# Patient Record
Sex: Female | Born: 1960
Health system: Southern US, Community
[De-identification: ages and names within clinical notes are randomized; demographics above are authoritative.]

## PROBLEM LIST (undated history)

## (undated) DIAGNOSIS — Z789 Other specified health status: Secondary | ICD-10-CM

## (undated) DIAGNOSIS — Z8619 Personal history of other infectious and parasitic diseases: Secondary | ICD-10-CM

## (undated) DIAGNOSIS — Q030 Malformations of aqueduct of Sylvius: Secondary | ICD-10-CM

## (undated) HISTORY — DX: Other specified health status: Z78.9

## (undated) HISTORY — PX: OOPHORECTOMY: SHX86

## (undated) HISTORY — PX: MICRODISCECTOMY LUMBAR: SUR864

## (undated) HISTORY — DX: Malformations of aqueduct of Sylvius: Q03.0

## (undated) HISTORY — DX: Personal history of other infectious and parasitic diseases: Z86.19

## (undated) HISTORY — PX: OTHER SURGICAL HISTORY: SHX169

---

## 2000-11-23 ENCOUNTER — Encounter (INDEPENDENT_AMBULATORY_CARE_PROVIDER_SITE_OTHER): Payer: Self-pay | Admitting: Specialist

## 2000-11-23 ENCOUNTER — Other Ambulatory Visit: Admission: RE | Admit: 2000-11-23 | Discharge: 2000-11-23 | Payer: Self-pay | Admitting: Obstetrics and Gynecology

## 2001-12-07 ENCOUNTER — Encounter: Payer: Self-pay | Admitting: Family Medicine

## 2001-12-07 ENCOUNTER — Ambulatory Visit (HOSPITAL_COMMUNITY): Admission: RE | Admit: 2001-12-07 | Discharge: 2001-12-07 | Payer: Self-pay | Admitting: Family Medicine

## 2003-12-25 ENCOUNTER — Other Ambulatory Visit: Admission: RE | Admit: 2003-12-25 | Discharge: 2003-12-25 | Payer: Self-pay | Admitting: Obstetrics and Gynecology

## 2005-06-19 ENCOUNTER — Other Ambulatory Visit: Admission: RE | Admit: 2005-06-19 | Discharge: 2005-06-19 | Payer: Self-pay | Admitting: Obstetrics and Gynecology

## 2008-12-20 ENCOUNTER — Encounter: Payer: Self-pay | Admitting: Family Medicine

## 2009-11-21 ENCOUNTER — Ambulatory Visit: Payer: Self-pay | Admitting: Family Medicine

## 2009-11-21 DIAGNOSIS — G43909 Migraine, unspecified, not intractable, without status migrainosus: Secondary | ICD-10-CM | POA: Insufficient documentation

## 2009-11-21 DIAGNOSIS — Z862 Personal history of diseases of the blood and blood-forming organs and certain disorders involving the immune mechanism: Secondary | ICD-10-CM | POA: Insufficient documentation

## 2009-12-26 ENCOUNTER — Ambulatory Visit: Payer: Self-pay | Admitting: Family Medicine

## 2010-05-17 ENCOUNTER — Ambulatory Visit: Payer: Self-pay | Admitting: Family Medicine

## 2010-05-28 ENCOUNTER — Ambulatory Visit: Payer: Self-pay | Admitting: Family Medicine

## 2010-05-28 LAB — CONVERTED CEMR LAB
ALT: 16 units/L (ref 0–35)
AST: 18 units/L (ref 0–37)
Albumin: 4.5 g/dL (ref 3.5–5.2)
Alkaline Phosphatase: 49 units/L (ref 39–117)
BUN: 14 mg/dL (ref 6–23)
Basophils Absolute: 0 10*3/uL (ref 0.0–0.1)
Basophils Relative: 0.4 % (ref 0.0–3.0)
Bilirubin Urine: NEGATIVE
Bilirubin, Direct: 0.1 mg/dL (ref 0.0–0.3)
Blood in Urine, dipstick: NEGATIVE
CO2: 28 meq/L (ref 19–32)
Calcium: 9.2 mg/dL (ref 8.4–10.5)
Chloride: 104 meq/L (ref 96–112)
Cholesterol: 184 mg/dL (ref 0–200)
Creatinine, Ser: 0.8 mg/dL (ref 0.4–1.2)
Eosinophils Absolute: 0.1 10*3/uL (ref 0.0–0.7)
Eosinophils Relative: 1.2 % (ref 0.0–5.0)
GFR calc non Af Amer: 79.56 mL/min (ref >60)
Glucose, Bld: 103 mg/dL — ABNORMAL HIGH (ref 70–99)
Glucose, Urine, Semiquant: NEGATIVE
HCT: 41.8 % (ref 36.0–46.0)
HDL: 61.1 mg/dL (ref >39.00)
Hemoglobin: 14.4 g/dL (ref 12.0–15.0)
Ketones, urine, test strip: NEGATIVE
LDL Cholesterol: 112 mg/dL — ABNORMAL HIGH (ref 0–99)
Lymphocytes Relative: 26.1 % (ref 12.0–46.0)
Lymphs Abs: 1.4 10*3/uL (ref 0.7–4.0)
MCHC: 34.4 g/dL (ref 30.0–36.0)
MCV: 95.4 fL (ref 78.0–100.0)
Monocytes Absolute: 0.2 10*3/uL (ref 0.1–1.0)
Monocytes Relative: 4.5 % (ref 3.0–12.0)
Neutro Abs: 3.6 10*3/uL (ref 1.4–7.7)
Neutrophils Relative %: 67.8 % (ref 43.0–77.0)
Nitrite: NEGATIVE
Platelets: 151 10*3/uL (ref 150.0–400.0)
Potassium: 4.4 meq/L (ref 3.5–5.1)
Protein, U semiquant: NEGATIVE
RBC: 4.38 M/uL (ref 3.87–5.11)
RDW: 13.2 % (ref 11.5–14.6)
Sodium: 139 meq/L (ref 135–145)
Specific Gravity, Urine: 1.015
TSH: 0.64 microintl units/mL (ref 0.35–5.50)
Total Bilirubin: 0.6 mg/dL (ref 0.3–1.2)
Total CHOL/HDL Ratio: 3
Total Protein: 7.1 g/dL (ref 6.0–8.3)
Triglycerides: 55 mg/dL (ref 0.0–149.0)
Urobilinogen, UA: 0.2
VLDL: 11 mg/dL (ref 0.0–40.0)
WBC Urine, dipstick: NEGATIVE
WBC: 5.3 10*3/uL (ref 4.5–10.5)
pH: 7

## 2010-06-11 ENCOUNTER — Ambulatory Visit: Payer: Self-pay | Admitting: Family Medicine

## 2010-09-03 ENCOUNTER — Encounter: Payer: Self-pay | Admitting: Family Medicine

## 2010-10-01 NOTE — Assessment & Plan Note (Signed)
Summary: CPX/CJR   Vital Signs:  Patient profile:   50 year old female Menstrual status:  irregular LMP:     06/11/2010 Height:      65.75 inches Weight:      130 pounds Temp:     97.8 degrees F oral Pulse rate:   80 / minute Pulse rhythm:   regular Resp:     12 per minute BP sitting:   110 / 70  (left arm) Cuff size:   regular  Vitals Entered By: Sid Falcon LPN (June 11, 2010 10:30 AM)  History of Present Illness: Here for CPE.  Sees gyn.  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 No hx of screening colonoscopy (turns 49 this week). Refuses flu vaccine. Walks for exercise.    Clinical Review Panels:  Immunizations   Last Tetanus Booster:  Tdap (12/26/2009)  Lipid Management   Cholesterol:  184 (05/28/2010)   LDL (bad choesterol):  112 (05/28/2010)   HDL (good cholesterol):  61.10 (05/28/2010)  Diabetes Management   Creatinine:  0.8 (05/28/2010)  CBC   WBC:  5.3 (05/28/2010)   RBC:  4.38 (05/28/2010)   Hgb:  14.4 (05/28/2010)   Hct:  41.8 (05/28/2010)   Platelets:  151.0 (05/28/2010)   MCV  95.4 (05/28/2010)   MCHC  34.4 (05/28/2010)   RDW  13.2 (05/28/2010)   PMN:  67.8 (05/28/2010)   Lymphs:  26.1 (05/28/2010)   Monos:  4.5 (05/28/2010)   Eosinophils:  1.2 (05/28/2010)   Basophil:  0.4 (05/28/2010)  Complete Metabolic Panel   Glucose:  103 (05/28/2010)   Sodium:  139 (05/28/2010)   Potassium:  4.4 (05/28/2010)   Chloride:  104 (05/28/2010)   CO2:  28 (05/28/2010)   BUN:  14 (05/28/2010)   Creatinine:  0.8 (05/28/2010)   Albumin:  4.5 (05/28/2010)   Total Protein:  7.1 (05/28/2010)   Calcium:  9.2 (05/28/2010)   Total Bili:  0.6 (05/28/2010)   Alk Phos:  49 (05/28/2010)   SGPT (ALT):  16 (05/28/2010)   SGOT (AST):  18 (05/28/2010)   Allergies: No Known Drug Allergies  Past History:  Past Medical History: Last updated: 11/21/2009 Anemia Migraine headaches  Family History: Last updated: 06/11/2010 Both  grandfathers, stroke history Mother died PE complications.  Social History: Last updated: 11/21/2009 Occupation:  Veterinarian Married Alcohol use-no Past smoker 2 pregnancies 3 live births  Risk Factors: Smoking Status: quit (11/21/2009)  Past Surgical History: micro diskectomy 1987 L5 S1.  Oophorectomy 1990 C-Section-twins 1993 Dermoid tumor removed from ovary PMH-FH-SH reviewed for relevance  Family History: Both grandfathers, stroke history Mother died PE complications.  Review of Systems  The patient denies anorexia, fever, weight loss, weight gain, vision loss, decreased hearing, hoarseness, chest pain, syncope, dyspnea on exertion, peripheral edema, prolonged cough, headaches, hemoptysis, abdominal pain, melena, hematochezia, severe indigestion/heartburn, hematuria, incontinence, genital sores, muscle weakness, suspicious skin lesions, transient blindness, difficulty walking, depression, unusual weight change, abnormal bleeding, enlarged lymph nodes, and breast masses.    Physical Exam  General:  Well-developed,well-nourished,in no acute distress; alert,appropriate and cooperative throughout examination Head:  Normocephalic and atraumatic without obvious abnormalities. No apparent alopecia or balding. Eyes:  No corneal or conjunctival inflammation noted. EOMI. Perrla. Funduscopic exam benign, without hemorrhages, exudates or papilledema. Vision grossly normal. Ears:  External ear exam shows no significant lesions or deformities.  Otoscopic examination reveals clear canals, tympanic membranes are intact bilaterally without bulging, retraction, inflammation or discharge. Hearing is grossly normal bilaterally. Mouth:  Oral mucosa and oropharynx without lesions or exudates.  Teeth in good repair. Neck:  No deformities, masses, or tenderness noted. Breasts:  gyn Lungs:  Normal respiratory effort, chest expands symmetrically. Lungs are clear to auscultation, no crackles or  wheezes. Heart:  Normal rate and regular rhythm. S1 and S2 normal without gallop, murmur, click, rub or other extra sounds. Abdomen:  Bowel sounds positive,abdomen soft and non-tender without masses, organomegaly or hernias noted. Rectal:  gyn Genitalia:  gyn Msk:  No deformity or scoliosis noted of thoracic or lumbar spine.   Extremities:  No clubbing, cyanosis, edema, or deformity noted with normal full range of motion of all joints.   Neurologic:  alert & oriented X3 and cranial nerves II-XII intact.   Skin:  no rashes and no suspicious lesions.   Cervical Nodes:  No lymphadenopathy noted Psych:  Cognition and judgment appear intact. Alert and cooperative with normal attention span and concentration. No apparent delusions, illusions, hallucinations   Impression & Recommendations:  Problem # 1:  Preventive Health Care (ICD-V70.0) labs reviewed with pt.  Schedule colonoscopy.  Cont regular exercise.  Cont regular gyn f/u.  Other Orders: Gastroenterology Referral (GI)  Anticoagulation Management Assessment/Plan:

## 2010-10-01 NOTE — Assessment & Plan Note (Signed)
Summary: TETANUS SHOT/CJR  Nurse Visit   Allergies: No Known Drug Allergies  Immunizations Administered:  Tetanus Vaccine:    Vaccine Type: Tdap    Site: left deltoid    Mfr: GlaxoSmithKline    Dose: 0.5 ml    Route: IM    Given by: Lynann Beaver CMA    Exp. Date: 11/24/2011    Lot #: [id number]  Orders Added: 1)  Tdap => 14yrs IM [90715] 2)  Admin 1st Vaccine [90471]

## 2010-10-01 NOTE — Assessment & Plan Note (Signed)
Summary: EVAL OF WOUND ON R HAND (CAT BITE) // RS   Vital Signs:  Patient profile:   50 year old female Menstrual status:  irregular Temp:     98.2 degrees F oral BP sitting:   120 / 80  (left arm) Cuff size:   regular  Vitals Entered By: Sid Falcon LPN (May 17, 2010 1:53 PM)  History of Present Illness: Patient seen same day appointment right thumb swelling.  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 Started herself on doxycycline without much improvement. Past couple days increased redness and swelling and pain. No fever or chills. Tetanus is up-to-date.  Cat had been vaccinated for rabies. Patient has also had prior rabies vaccine.  Allergies: No Known Drug Allergies  Review of Systems  The patient denies fever.    Physical Exam  General:  Well-developed,well-nourished,in no acute distress; alert,appropriate and cooperative throughout examination Mouth:  no gingival abnormalities.   Extremities:  right thumb reveals swelling erythema and mild tenderness involving most of the thumb with a couple of puncture wounds between the MCP and interphalangeal joint. She has full range of motion thumb  No involement of hand.   Impression & Recommendations:  Problem # 1:  CAT BITE (ICD-E906.3) Assessment New patient has cellulitis changes right thumb. Switch to Augmentin 875 mg b.i.d. to cover for Pasturella multocida.  Complete Medication List: 1)  Amoxicillin-pot Clavulanate 875-125 Mg Tabs (Amoxicillin-pot clavulanate) .... One by mouth two times a day for 10 days  Patient Instructions: 1)  Use topical heat to thumb several times daily. 2)  Touch base by Monday if not improving. Prescriptions: AMOXICILLIN-POT CLAVULANATE 875-125 MG TABS (AMOXICILLIN-POT CLAVULANATE) one by mouth two times a day for 10 days  #20 x 0   Entered and Authorized by:   Evelena Peat MD   Signed by:   Evelena Peat MD on 05/17/2010   Method used:   Print then Give to Patient  RxID:   [id number]

## 2010-10-01 NOTE — Assessment & Plan Note (Signed)
Summary: NEW PT TO LBF/TO EST/PT COMPLAINING OF TINGLING IN RT LEG/BAC...   Vital Signs:  Patient profile:   50 year old female Menstrual status:  irregular LMP:     10/14/2009 Height:      65.50 inches Weight:      132 pounds BMI:     21.71 Temp:     97.8 degrees F oral Pulse rate:   72 / minute Pulse rhythm:   regular Resp:     12 per minute BP sitting:   100 / 70  (left arm) Cuff size:   regular  Vitals Entered By: Sid Falcon LPN (November 21, 2009 11:25 AM) CC: New to establish, c/o tingling right leg, back pain LMP (date): 10/14/2009     Menstrual Status irregular Enter LMP: 10/14/2009   History of Present Illness: New patient to establish care.  Patient has history of migraine headaches which are very infrequent. Remote history of microdiscectomy 1987 which she thinks was left-sided. Takes no regular medications. Remote history of anemia secondary to menometrorrhagia. No recent dizziness.  Family history and social history are reviewed. She is married has 3 children. No history of smoking. Occasional alcohol use. Works as a International aid/development worker.  Acute issue is two-day history of some tingling sensation right lower extremity. No specific injury though symptoms did start after doing some light running 2 days ago. No back pain. No weakness. No loss of bladder or bowel control. Some Aleve without relief. Denies any other focal neurologic symptoms such as blurred vision, ataxia, focal weakness, or headache.  Preventive Screening-Counseling & Management  Alcohol-Tobacco     Smoking Status: quit     Year Started: 1975     Year Quit: 1991  Allergies (verified): No Known Drug Allergies  Past History:  Family History: Last updated: 11/21/2009 Both grandfathers, stroke history  Social History: Last updated: 11/21/2009 Occupation:  International aid/development worker Married Alcohol use-no Past smoker 2 pregnancies 3 live births  Risk Factors: Smoking Status: quit (11/21/2009)  Past  Medical History: Anemia Migraine headaches  Past Surgical History: micro diskectomy 1987 Oophorectomy 1990 C-Section-twins 1993 Dermoid tumor removed from ovary  Family History: Both grandfathers, stroke history  Social History: Occupation:  International aid/development worker Married Alcohol use-no Past smoker 2 pregnancies 3 live births Occupation:  employed Smoking Status:  quit  Review of Systems  The patient denies anorexia, fever, weight loss, vision loss, decreased hearing, chest pain, syncope, dyspnea on exertion, peripheral edema, headaches, incontinence, and muscle weakness.    Physical Exam  General:  Well-developed,well-nourished,in no acute distress; alert,appropriate and cooperative throughout examination Eyes:  No corneal or conjunctival inflammation noted. EOMI. Perrla. Funduscopic exam benign, without hemorrhages, exudates or papilledema. Vision grossly normal. Neck:  No deformities, masses, or tenderness noted. Lungs:  Normal respiratory effort, chest expands symmetrically. Lungs are clear to auscultation, no crackles or wheezes. Heart:  Normal rate and regular rhythm. S1 and S2 normal without gallop, murmur, click, rub or other extra sounds. Msk:  normal ROM hip. Extremities:  No clubbing, cyanosis, edema, or deformity noted with normal full range of motion of all joints.   Neurologic:  alert & oriented X3, cranial nerves II-XII intact, strength normal in all extremities, gait normal, DTRs symmetrical and normal, and toes down bilaterally on Babinski.     Impression & Recommendations:  Problem # 1:  PARESTHESIA (ICD-782.0) Assessment New nonfocal neuro exam.  Observe for now since sxs only of 2 days duration.  Consider MRI brain if sxs persist or worsen.  Doubt lumbar  nerve root compression as pt has no pain and no back pain.  Problem # 2:  MIGRAINE HEADACHE (ICD-346.90) Assessment: Comment Only  Problem # 3:  ANEMIA, HX OF (ICD-V12.3) Assessment: Comment  Only  Preventive Care Screening  Last Tetanus Booster:    Date:  09/02/1999    Results:  Historical

## 2010-10-03 NOTE — Letter (Signed)
Summary: Referral - not able to see patient  [facility]  [address] [place], [place] [address]   Phone: [phone]  Fax: [phone]    September 03, 2010   Evelena Peat, MD  [address] [place], [place] [address]   Re:   Ann Henderson DOB:  May 24, 1961 MRN:   [id number]    Dear Dr. Caryl Never:  Thank you for your kind referral of the above patient.  We have attempted to schedule the recommended procedure Screening Colonscopy but have not been able to schedule because:  X   The patient was not available by phone and/or has not returned our calls.  ___ The patient declined to schedule the procedure at this time.  We appreciate the referral and hope that we will have the opportunity to treat this patient in the future.    Sincerely,    [facility] [phone]

## 2011-01-30 ENCOUNTER — Ambulatory Visit (INDEPENDENT_AMBULATORY_CARE_PROVIDER_SITE_OTHER): Payer: BC Managed Care – PPO | Admitting: Family Medicine

## 2011-01-30 ENCOUNTER — Encounter: Payer: Self-pay | Admitting: Family Medicine

## 2011-01-30 VITALS — BP 100/70 | Temp 98.0°F

## 2011-01-30 DIAGNOSIS — M79602 Pain in left arm: Secondary | ICD-10-CM

## 2011-01-30 DIAGNOSIS — M79609 Pain in unspecified limb: Secondary | ICD-10-CM

## 2011-01-30 NOTE — Progress Notes (Signed)
  Subjective:    Patient ID: Ann Henderson, female    DOB: [birth year], 50 y.o.   MRN: [id number]  HPI Patient seen with left arm pain. Started back in March. 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 Burning sensation left axillary region with radiation of pain toward elbow. No numbness or weakness. No neck pain. No muscle atrophy. Advil without much relief. Still lifting weights and exercising without much difficulty. Pain exacerbated by reaching overhead. No biceps pain. No shoulder pain   Review of Systems  Cardiovascular: Negative for chest pain.  Neurological: Negative for weakness and headaches.  Hematological: Negative for adenopathy. Does not bruise/bleed easily.       Objective:   Physical Exam  Constitutional: She appears well-developed and well-nourished. No distress.  Neck: Neck supple. No thyromegaly present.  Cardiovascular: Normal rate, regular rhythm and normal heart sounds.   No murmur heard. Pulmonary/Chest: Effort normal and breath sounds normal. No respiratory distress. She has no wheezes. She has no rales.  Musculoskeletal: She exhibits no edema.       Left shoulder full range of motion. No atrophy left upper extremity. Strength full throughout. No bicipital tenderness. No triceps tenderness. No deltoid tenderness. No axillary mass. Full range of motion left elbow. No axillary adenopathy  Lymphadenopathy:    She has no cervical adenopathy.  Neurological:       Deep tendon reflexes symmetric upper extremities. Normal sensory function. No weakness  Skin: No rash noted.          Assessment & Plan:  Left arm pain. Suspect brachial plexus injury. No neurologic signs of weakness. Reassurance and observation for now. Touch base in one month if not improving further

## 2011-05-07 ENCOUNTER — Emergency Department (HOSPITAL_COMMUNITY)
Admission: EM | Admit: 2011-05-07 | Discharge: 2011-05-07 | Disposition: A | Discharge status: Home / Self Care | Payer: BC Managed Care – PPO | Attending: Emergency Medicine | Admitting: Emergency Medicine

## 2011-05-07 ENCOUNTER — Emergency Department (HOSPITAL_COMMUNITY): Payer: BC Managed Care – PPO

## 2011-05-07 DIAGNOSIS — F29 Unspecified psychosis not due to a substance or known physiological condition: Secondary | ICD-10-CM | POA: Insufficient documentation

## 2011-05-07 DIAGNOSIS — M549 Dorsalgia, unspecified: Secondary | ICD-10-CM | POA: Insufficient documentation

## 2011-05-07 DIAGNOSIS — R51 Headache: Secondary | ICD-10-CM | POA: Insufficient documentation

## 2011-05-07 DIAGNOSIS — M542 Cervicalgia: Secondary | ICD-10-CM | POA: Insufficient documentation

## 2011-05-07 DIAGNOSIS — S060X0A Concussion without loss of consciousness, initial encounter: Secondary | ICD-10-CM | POA: Insufficient documentation

## 2011-05-07 LAB — SAMPLE TO BLOOD BANK

## 2011-12-18 ENCOUNTER — Encounter: Payer: Self-pay | Admitting: Family Medicine

## 2011-12-18 ENCOUNTER — Ambulatory Visit (INDEPENDENT_AMBULATORY_CARE_PROVIDER_SITE_OTHER): Payer: BC Managed Care – PPO | Admitting: Family Medicine

## 2011-12-18 VITALS — BP 90/70 | Temp 97.8°F | Wt 130.0 lb

## 2011-12-18 DIAGNOSIS — R202 Paresthesia of skin: Secondary | ICD-10-CM

## 2011-12-18 DIAGNOSIS — R209 Unspecified disturbances of skin sensation: Secondary | ICD-10-CM
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
## 2011-12-30 ENCOUNTER — Encounter: Payer: Self-pay | Admitting: Neurology

## 2011-12-30 ENCOUNTER — Ambulatory Visit (INDEPENDENT_AMBULATORY_CARE_PROVIDER_SITE_OTHER): Payer: BC Managed Care – PPO | Admitting: Neurology

## 2011-12-30 VITALS — BP 108/70 | HR 72 | Wt 130.0 lb

## 2011-12-30 DIAGNOSIS — R531 Weakness: Secondary | ICD-10-CM

## 2011-12-30 DIAGNOSIS — Z8669 Personal history of other diseases of the nervous system and sense organs: Secondary | ICD-10-CM

## 2011-12-30 DIAGNOSIS — M6281 Muscle weakness (generalized): Secondary | ICD-10-CM
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
## 2011-12-30 NOTE — Patient Instructions (Signed)
Your MRI is scheduled for Friday, May 3rd at 7:00pm.  Please arrive to [facility] MRI by 6:45pm.  [phone].

## 2012-01-02 ENCOUNTER — Ambulatory Visit (HOSPITAL_COMMUNITY)
Admission: RE | Admit: 2012-01-02 | Discharge: 2012-01-02 | Disposition: A | Discharge status: Home / Self Care | Payer: BC Managed Care – PPO | Source: Ambulatory Visit | Attending: Neurology | Admitting: Neurology

## 2012-01-02 DIAGNOSIS — R209 Unspecified disturbances of skin sensation: Secondary | ICD-10-CM | POA: Insufficient documentation

## 2012-01-02 DIAGNOSIS — R531 Weakness: Secondary | ICD-10-CM

## 2012-01-02 DIAGNOSIS — Z8669 Personal history of other diseases of the nervous system and sense organs: Secondary | ICD-10-CM

## 2012-01-02 DIAGNOSIS — G911 Obstructive hydrocephalus: Secondary | ICD-10-CM | POA: Insufficient documentation

## 2012-01-02 MED ORDER — GADOBENATE DIMEGLUMINE 529 MG/ML IV SOLN
12.0000 mL | Freq: Once | INTRAVENOUS | Status: AC | PRN
  Administered 2012-01-02: 12 mL via INTRAVENOUS

## 2012-01-02 MED ORDER — GADOBENATE DIMEGLUMINE 529 MG/ML IV SOLN: 15.0000 mL | Freq: Once | INTRAVENOUS | Status: AC | PRN

## 2012-01-05 ENCOUNTER — Other Ambulatory Visit: Payer: Self-pay

## 2012-01-05 ENCOUNTER — Telehealth: Payer: Self-pay

## 2012-01-05 DIAGNOSIS — G959 Disease of spinal cord, unspecified: Secondary | ICD-10-CM

## 2012-01-05 NOTE — Telephone Encounter (Signed)
Pt notified of C spine MRI 01/09/12 at 8:00am.  [facility] MRI.

## 2012-01-05 NOTE — Telephone Encounter (Signed)
Message copied by Lelon Huh on Mon Jan 05, 2012  4:36 PM ------      Message from: Milas Gain      Created: Mon Jan 05, 2012  2:25 PM       Talked to patient, no parenchymal lesions, just old hydrocephalus.            Tahani Potier - could you set up an MRI of her cervical spine with and without contrast ?myelopathy and call the patient.

## 2012-01-09 ENCOUNTER — Ambulatory Visit (HOSPITAL_COMMUNITY)
Admission: RE | Admit: 2012-01-09 | Discharge: 2012-01-09 | Disposition: A | Discharge status: Home / Self Care | Payer: BC Managed Care – PPO | Source: Ambulatory Visit | Attending: Neurology | Admitting: Neurology

## 2012-01-09 DIAGNOSIS — N882 Stricture and stenosis of cervix uteri: Secondary | ICD-10-CM | POA: Insufficient documentation

## 2012-01-09 DIAGNOSIS — R209 Unspecified disturbances of skin sensation: Secondary | ICD-10-CM | POA: Insufficient documentation

## 2012-01-09 DIAGNOSIS — G959 Disease of spinal cord, unspecified: Secondary | ICD-10-CM

## 2012-01-09 MED ORDER — GADOBENATE DIMEGLUMINE 529 MG/ML IV SOLN
15.0000 mL | Freq: Once | INTRAVENOUS | Status: AC | PRN
  Administered 2012-01-09: 11 mL via INTRAVENOUS

## 2012-01-13 ENCOUNTER — Telehealth: Payer: Self-pay | Admitting: Neurology

## 2012-01-13 NOTE — Telephone Encounter (Signed)
Pt would like results of MRI performed on Friday 01/09/2012.

## 2012-01-14 ENCOUNTER — Telehealth: Payer: Self-pay | Admitting: Neurology

## 2012-01-14 NOTE — Telephone Encounter (Signed)
Message copied by Benay Spice on Wed Jan 14, 2012  9:34 AM ------      Message from: Milas Gain      Created: Tue Jan 13, 2012  4:03 PM       Jan - Let her know some mild arthritis with left sided moderate foraminal stenosis at C3, but otherwise no canal stenosis.  All in all spine looks very good.  No spinal cord changes that would account for sensory changes.  No signs of MS.

## 2012-01-14 NOTE — Telephone Encounter (Signed)
I think we should just watch and wait for now.  If she has worsening symptoms she should give me a call.  Otherwise I will see her back in 3 months.  Depending on her exam and her symptoms at that point we will consider rescanning her.

## 2012-01-14 NOTE — Telephone Encounter (Signed)
Called and spoke with the patient. Information given as per Dr. Modesto Charon re: MRI results. The patient wants to know what to do next? She has a f/u in 3 months and wonders if this is just something she will have to learn to live with. She would like to know what Dr. Modesto Charon recommends. She is aware he is out of the office today and is ok to wait. **Dr. Modesto Charon, please advise next step issues. Thanks.

## 2012-01-15 NOTE — Telephone Encounter (Signed)
Called and spoke with the patient. Information shared with the patient as per Dr. Modesto Charon below. No additional concerns voiced at this time.

## 2012-03-02 DIAGNOSIS — G919 Hydrocephalus, unspecified: Secondary | ICD-10-CM | POA: Insufficient documentation

## 2012-03-22 ENCOUNTER — Ambulatory Visit: Payer: BC Managed Care – PPO | Admitting: Family Medicine

## 2012-03-23 ENCOUNTER — Encounter: Payer: Self-pay | Admitting: Family Medicine

## 2012-03-23 NOTE — Progress Notes (Signed)
This encounter was created in error - please disregard.

## 2012-03-24 ENCOUNTER — Telehealth: Payer: Self-pay | Admitting: *Deleted

## 2012-03-24 MED ORDER — DOXYCYCLINE HYCLATE 100 MG PO TABS: 100.0000 mg | ORAL_TABLET | Freq: Two times a day (BID) | ORAL | Status: AC

## 2012-03-24 NOTE — Telephone Encounter (Signed)
I informed pt of Rx electronically, she was at [facility] waiting, so she asked call it in also as she is waiting, so I did call it in.

## 2012-03-24 NOTE — Telephone Encounter (Signed)
Patient recently started herself on doxycycline. She had some vague neurologic symptoms with neurologic workup per neurologist including MR angiogram and MRI the brain unrevealing of clear etiology. She was concerned about possibility of Lyme disease and feels that her symptoms have improved since starting herself on doxycycline. She is basically calling requesting extension of doxycycline. 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 She has not had any clear rash suggestive of erythema chronica migrans. She states was diagnosed with Lyme disease several years ago. I spoke with her by phone. Even though her symptoms are very equivocal and not clear this is related to Lyme disease in any way we agreed to full three-week course of doxycycline 100 mg twice a day.  If she is interested in serologic testing would recommend office followup 2 discussed pros and cons and various tests available

## 2012-03-30 ENCOUNTER — Ambulatory Visit: Payer: BC Managed Care – PPO | Admitting: Neurology

## 2013-05-26 ENCOUNTER — Other Ambulatory Visit: Payer: Self-pay | Admitting: Obstetrics and Gynecology

## 2013-05-26 DIAGNOSIS — Z1231 Encounter for screening mammogram for malignant neoplasm of breast: Secondary | ICD-10-CM

## 2013-06-14 ENCOUNTER — Ambulatory Visit
Admission: RE | Admit: 2013-06-14 | Discharge: 2013-06-14 | Disposition: A | Discharge status: Home / Self Care | Payer: BC Managed Care – PPO | Source: Ambulatory Visit | Attending: Obstetrics and Gynecology | Admitting: Obstetrics and Gynecology

## 2013-06-14 DIAGNOSIS — Z1231 Encounter for screening mammogram for malignant neoplasm of breast: Secondary | ICD-10-CM

## 2014-06-02 ENCOUNTER — Ambulatory Visit (INDEPENDENT_AMBULATORY_CARE_PROVIDER_SITE_OTHER): Payer: BC Managed Care – PPO | Admitting: Family Medicine

## 2014-06-02 ENCOUNTER — Encounter: Payer: Self-pay | Admitting: Family Medicine

## 2014-06-02 VITALS — BP 120/80 | HR 68 | Temp 97.6°F | Wt 130.0 lb

## 2014-06-02 DIAGNOSIS — H8110 Benign paroxysmal vertigo, unspecified ear: Secondary | ICD-10-CM

## 2014-06-02 NOTE — Progress Notes (Signed)
Pre visit review using our clinic review tool, if applicable. No additional management support is needed unless otherwise documented below in the visit note. 

## 2014-06-02 NOTE — Patient Instructions (Signed)
Benign Positional Vertigo Vertigo means you feel like you or your surroundings are moving when they are not. Benign positional vertigo is the most common form of vertigo. Benign means that the cause of your condition is not serious. Benign positional vertigo is more common in older adults. CAUSES  Benign positional vertigo is the result of an upset in the labyrinth system. This is an area in the middle ear that helps control your balance. This may be caused by a viral infection, head injury, or repetitive motion. However, often no specific cause is found. SYMPTOMS  Symptoms of benign positional vertigo occur when you move your head or eyes in different directions. Some of the symptoms may include:  Loss of balance and falls.  Vomiting.  Blurred vision.  Dizziness.  Nausea.  Involuntary eye movements (nystagmus). DIAGNOSIS  Benign positional vertigo is usually diagnosed by physical exam. If the specific cause of your benign positional vertigo is unknown, your caregiver may perform imaging tests, such as magnetic resonance imaging (MRI) or computed tomography (CT). TREATMENT  Your caregiver may recommend movements or procedures to correct the benign positional vertigo. Medicines such as meclizine, benzodiazepines, and medicines for nausea may be used to treat your symptoms. In rare cases, if your symptoms are caused by certain conditions that affect the inner ear, you may need surgery. HOME CARE INSTRUCTIONS   Follow your caregiver's instructions.  Move slowly. Do not make sudden body or head movements.  Avoid driving.  Avoid operating heavy machinery.  Avoid performing any tasks that would be dangerous to you or others during a vertigo episode.  Drink enough fluids to keep your urine clear or pale yellow. SEEK IMMEDIATE MEDICAL CARE IF:   You develop problems with walking, weakness, numbness, or using your arms, hands, or legs.  You have difficulty speaking.  You develop  severe headaches.  Your nausea or vomiting continues or gets worse.  You develop visual changes.  Your family or friends notice any behavioral changes.  Your condition gets worse.  You have a fever.  You develop a stiff neck or sensitivity to light. MAKE SURE YOU:   Understand these instructions.  Will watch your condition.  Will get help right away if you are not doing well or get worse. Document Released: 05/26/2006 Document Revised: 11/10/2011 Document Reviewed: 05/08/2011 ExitCare Patient Information 2015 [employer]. This information is not intended to replace advice given to you by your health care provider. Make sure you discuss any questions you have with your health care provider.    

## 2014-06-02 NOTE — Progress Notes (Signed)
   Subjective:    Patient ID: Ann Henderson, female    DOB: 01/15/1961, 53 y.o.   MRN: [id number]  Dizziness Pertinent negatives include no chest pain, chills, fever or weakness.   Acute visit for vertigo. Onset 2 days ago. First noted when turning over in bed. Symptoms improved today. Mild nausea but no vomiting. No hearing changes. No focal weakness. No ataxia. No visual changes. Symptoms are stable at this time. She has had some recent nasal congestion but no fever or chills. Vertigo symptoms also improved yesterday after getting up and moving around.  No past medical history on file. Past Surgical History  Procedure Laterality Date  . Microdiscectomy lumbar    . Dermoid tumor    . Oophorectomy    . Cesarean section      reports that she quit smoking about 24 years ago. She has never used smokeless tobacco. She reports that she drinks alcohol. Her drug history is not on file. family history is not on file. No Known Allergies    Review of Systems  Constitutional: Negative for fever, chills, appetite change and unexpected weight change.  Respiratory: Negative for shortness of breath.   Cardiovascular: Negative for chest pain.  Neurological: Positive for dizziness. Negative for weakness.       Objective:   Physical Exam  Constitutional: She is oriented to person, place, and time. She appears well-developed and well-nourished. No distress.  HENT:  Right Ear: External ear normal.  Left Ear: External ear normal.  Neck: Neck supple. No thyromegaly present.  Cardiovascular: Normal rate and regular rhythm.   Pulmonary/Chest: Effort normal and breath sounds normal. No respiratory distress. She has no wheezes. She has no rales.  Lymphadenopathy:    She has no cervical adenopathy.  Neurological: She is alert and oriented to person, place, and time. No cranial nerve deficit.  She has some horizontal nystagmus with rapid direction to the left. No vertical nystagmus            Assessment & Plan:  Vertigo. Suspect benign peripheral positional vertigo. Reassurance. To try over-the-counter decongestant.  Reviewed signs and symptoms of more worrisome vertigo.  Vestibular rehab if symptoms persist.

## 2015-01-05 ENCOUNTER — Telehealth: Payer: Self-pay | Admitting: Family Medicine

## 2015-01-05 NOTE — Telephone Encounter (Signed)
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 Pt started herself on doxycycline. Pt does not need rx call in to pharm just the name of abx. Pt does not want to be seen.

## 2015-01-05 NOTE — Telephone Encounter (Signed)
Per Dr. Leonard SchwartzB start on Augmentin 875 BID for 10 days. Pt is informed. Pt is able to get medication

## 2015-01-19 ENCOUNTER — Telehealth: Payer: Self-pay | Admitting: Family Medicine

## 2015-01-19 NOTE — Telephone Encounter (Signed)
PLEASE NOTE: All timestamps contained within this report are represented as [place] Standard Time. CONFIDENTIALTY NOTICE: This fax transmission is intended only for the addressee. It contains information that is legally privileged, confidential or otherwise protected from use or disclosure. If you are not the intended recipient, you are strictly prohibited from reviewing, disclosing, copying using or disseminating any of this information or taking any action in reliance on or regarding this information. If you have received this fax in error, please notify us immediately by telephone so that we can arrange for its return to us. Phone: [phone], Toll-Free: [phone], Fax: [phone] Page: 1 of 1 Call Id: [id number] [facility] - Client TELEPHONE ADVICE RECORD [facility] Patient Name: Ann Henderson DOB: 05-04-1961 Initial Comment Caller states she has poison ivy and not getting any better. Nurse Assessment Nurse: Elijah Birkaldwell, RN, Lynda Date/Time (Eastern Time): 01/19/2015 1:36:57 PM Confirm and document reason for call. If symptomatic, describe symptoms. ---Caller states she has poison ivy and not getting any better. Used the rest room in wooded area, has a rash after using a leaf to wipe, but rash has spread in that area & rectal region. She is a International aid/development workerveterinarian. Started herself on steroids - 30 mg twice each day for the last couple days. Not sure if it is poison ivy or yeast infection. Had been on antibiotics before this started after a cat bite. Has tried a topical cream & yeast rx. The symptoms have been over a week now. Has the patient traveled out of the country within the last 30 days? ---Not Applicable Does the patient require triage? ---Yes Related visit to physician within the last 2 weeks? ---No Does the PT have any chronic conditions? (i.e. diabetes, asthma, etc.) ---No Did the patient indicate they were pregnant?  ---No Guidelines Guideline Title Affirmed Question Affirmed Notes Poison Ivy - Oak - [place] [1] Face, eyes, lips or genitals are involved AND [2] more than a small rash Final Disposition User See Physician within 24 Hours Columbine Valleyaldwell, RN, [employer]da Comments Caller states she is unsure whether or not to wait this out on the steroids, or whether to be seen. She is fairly sure she just grabbed a leaf from a tree that was not poison ivy. Suggested a few more ideas of care - Aveeno oatmeal bath, or soaking in saline bath, using 1% Hydrocortisone cream. She does not want an appt. now, may go to a walk-in clinic if needed.

## 2015-01-19 NOTE — Telephone Encounter (Signed)
Noted  

## 2015-01-22 NOTE — Telephone Encounter (Signed)
Let her know we are happy to see if rash not getting better.  If she feels more comfortable seeing a female provider, maybe we could set up with Dr Selena BattenKim or Fabian SharpPanosh.

## 2015-01-22 NOTE — Telephone Encounter (Signed)
Left message for patient to return call.

## 2015-01-24 NOTE — Telephone Encounter (Signed)
Left message for patient to return call.

## 2015-10-11 ENCOUNTER — Other Ambulatory Visit: Payer: Self-pay | Admitting: Obstetrics and Gynecology

## 2015-10-11 DIAGNOSIS — R928 Other abnormal and inconclusive findings on diagnostic imaging of breast: Secondary | ICD-10-CM

## 2015-10-16 ENCOUNTER — Ambulatory Visit
Admission: RE | Admit: 2015-10-16 | Discharge: 2015-10-16 | Disposition: A | Discharge status: Home / Self Care | Payer: BLUE CROSS/BLUE SHIELD | Source: Ambulatory Visit | Attending: Obstetrics and Gynecology | Admitting: Obstetrics and Gynecology

## 2015-10-16 DIAGNOSIS — R928 Other abnormal and inconclusive findings on diagnostic imaging of breast: Secondary | ICD-10-CM

## 2016-11-19 DIAGNOSIS — H524 Presbyopia: Secondary | ICD-10-CM | POA: Diagnosis not present

## 2016-11-19 DIAGNOSIS — H5203 Hypermetropia, bilateral: Secondary | ICD-10-CM | POA: Diagnosis not present

## 2016-11-19 DIAGNOSIS — H04123 Dry eye syndrome of bilateral lacrimal glands: Secondary | ICD-10-CM | POA: Diagnosis not present

## 2016-11-25 ENCOUNTER — Other Ambulatory Visit: Payer: Self-pay | Admitting: Obstetrics and Gynecology

## 2016-11-25 DIAGNOSIS — Z6821 Body mass index (BMI) 21.0-21.9, adult: Secondary | ICD-10-CM | POA: Diagnosis not present

## 2016-11-25 DIAGNOSIS — E2839 Other primary ovarian failure: Secondary | ICD-10-CM

## 2016-11-25 DIAGNOSIS — Z1231 Encounter for screening mammogram for malignant neoplasm of breast: Secondary | ICD-10-CM | POA: Diagnosis not present

## 2016-11-25 DIAGNOSIS — Z0000 Encounter for general adult medical examination without abnormal findings: Secondary | ICD-10-CM | POA: Diagnosis not present

## 2016-11-25 DIAGNOSIS — Z01419 Encounter for gynecological examination (general) (routine) without abnormal findings: Secondary | ICD-10-CM | POA: Diagnosis not present

## 2016-11-25 DIAGNOSIS — Z1389 Encounter for screening for other disorder: Secondary | ICD-10-CM | POA: Diagnosis not present

## 2016-12-16 ENCOUNTER — Ambulatory Visit
Admission: RE | Admit: 2016-12-16 | Discharge: 2016-12-16 | Disposition: A | Discharge status: Home / Self Care | Payer: BLUE CROSS/BLUE SHIELD | Source: Ambulatory Visit | Attending: Obstetrics and Gynecology | Admitting: Obstetrics and Gynecology

## 2016-12-16 DIAGNOSIS — Z780 Asymptomatic menopausal state: Secondary | ICD-10-CM | POA: Diagnosis not present

## 2016-12-16 DIAGNOSIS — M85852 Other specified disorders of bone density and structure, left thigh: Secondary | ICD-10-CM | POA: Diagnosis not present

## 2016-12-16 DIAGNOSIS — E2839 Other primary ovarian failure: Secondary | ICD-10-CM

## 2016-12-16 DIAGNOSIS — Z6821 Body mass index (BMI) 21.0-21.9, adult: Secondary | ICD-10-CM

## 2017-11-20 ENCOUNTER — Telehealth: Payer: Self-pay | Admitting: *Deleted

## 2017-11-20 NOTE — Telephone Encounter (Signed)
Dr. Caryl NeverBurchette, okay to transfer?

## 2017-11-20 NOTE — Telephone Encounter (Signed)
Copied from CRM [id number]. Topic: General - Other >> Nov 20, 2017 11:23 AM Arlyss Gandyichardson, Taren N, NT wrote: Reason for CRM: Pt would like to transfer care from Dr. Caryl NeverBurchette to Dr. Mardelle MatteAndy at [place] due to location.

## 2017-11-20 NOTE — Telephone Encounter (Signed)
Dr. Mardelle MatteAndy, okay to transfer?

## 2017-11-20 NOTE — Telephone Encounter (Signed)
Yes, please schedule. Thanks.

## 2017-11-20 NOTE — Telephone Encounter (Signed)
yes

## 2017-11-23 NOTE — Telephone Encounter (Signed)
LM for pt to return call for scheduling. °

## 2017-12-04 ENCOUNTER — Other Ambulatory Visit: Payer: Self-pay

## 2017-12-04 ENCOUNTER — Encounter: Payer: Self-pay | Admitting: Family Medicine

## 2017-12-04 ENCOUNTER — Ambulatory Visit: Payer: BLUE CROSS/BLUE SHIELD | Admitting: Family Medicine

## 2017-12-04 ENCOUNTER — Ambulatory Visit (INDEPENDENT_AMBULATORY_CARE_PROVIDER_SITE_OTHER): Payer: BLUE CROSS/BLUE SHIELD

## 2017-12-04 VITALS — BP 104/60 | HR 59 | Temp 97.6°F | Ht 65.75 in | Wt 128.8 lb

## 2017-12-04 DIAGNOSIS — Z0000 Encounter for general adult medical examination without abnormal findings: Secondary | ICD-10-CM

## 2017-12-04 DIAGNOSIS — R0781 Pleurodynia: Secondary | ICD-10-CM

## 2017-12-04 DIAGNOSIS — Q030 Malformations of aqueduct of Sylvius: Secondary | ICD-10-CM | POA: Diagnosis not present

## 2017-12-04 DIAGNOSIS — Z0001 Encounter for general adult medical examination with abnormal findings: Secondary | ICD-10-CM

## 2017-12-04 DIAGNOSIS — R0789 Other chest pain: Secondary | ICD-10-CM | POA: Diagnosis not present

## 2017-12-04 DIAGNOSIS — K429 Umbilical hernia without obstruction or gangrene: Secondary | ICD-10-CM | POA: Insufficient documentation

## 2017-12-04 DIAGNOSIS — J302 Other seasonal allergic rhinitis: Secondary | ICD-10-CM | POA: Insufficient documentation

## 2017-12-04 DIAGNOSIS — Z8619 Personal history of other infectious and parasitic diseases: Secondary | ICD-10-CM

## 2017-12-04 DIAGNOSIS — K635 Polyp of colon: Secondary | ICD-10-CM | POA: Diagnosis not present

## 2017-12-04 DIAGNOSIS — Z789 Other specified health status: Secondary | ICD-10-CM | POA: Insufficient documentation

## 2017-12-04 DIAGNOSIS — M47812 Spondylosis without myelopathy or radiculopathy, cervical region: Secondary | ICD-10-CM | POA: Insufficient documentation

## 2017-12-04 HISTORY — DX: Other specified health status: Z78.9

## 2017-12-04 HISTORY — DX: Personal history of other infectious and parasitic diseases: Z86.19

## 2017-12-04 HISTORY — DX: Malformations of aqueduct of Sylvius: Q03.0

## 2017-12-04 LAB — CBC WITH DIFFERENTIAL/PLATELET
BASOS PCT: 0.5 % (ref 0.0–3.0)
Basophils Absolute: 0 10*3/uL (ref 0.0–0.1)
EOS PCT: 1.1 % (ref 0.0–5.0)
Eosinophils Absolute: 0 10*3/uL (ref 0.0–0.7)
HEMATOCRIT: 41.7 % (ref 36.0–46.0)
HEMOGLOBIN: 14.4 g/dL (ref 12.0–15.0)
LYMPHS PCT: 37.7 % (ref 12.0–46.0)
Lymphs Abs: 1.7 10*3/uL (ref 0.7–4.0)
MCHC: 34.5 g/dL (ref 30.0–36.0)
MCV: 93.8 fl (ref 78.0–100.0)
Monocytes Absolute: 0.2 10*3/uL (ref 0.1–1.0)
Monocytes Relative: 4.9 % (ref 3.0–12.0)
Neutro Abs: 2.5 10*3/uL (ref 1.4–7.7)
Neutrophils Relative %: 55.8 % (ref 43.0–77.0)
Platelets: 158 10*3/uL (ref 150.0–400.0)
RBC: 4.45 Mil/uL (ref 3.87–5.11)
RDW: 12.9 % (ref 11.5–15.5)
WBC: 4.5 10*3/uL (ref 4.0–10.5)

## 2017-12-04 LAB — LIPID PANEL
CHOLESTEROL: 212 mg/dL — AB (ref 0–200)
HDL: 69.8 mg/dL (ref >39.00)
LDL CALC: 130 mg/dL — AB (ref 0–99)
NonHDL: 142.45
TRIGLYCERIDES: 63 mg/dL (ref 0.0–149.0)
Total CHOL/HDL Ratio: 3
VLDL: 12.6 mg/dL (ref 0.0–40.0)

## 2017-12-04 LAB — COMPREHENSIVE METABOLIC PANEL
ALBUMIN: 4.7 g/dL (ref 3.5–5.2)
ALT: 20 U/L (ref 0–35)
AST: 22 U/L (ref 0–37)
Alkaline Phosphatase: 67 U/L (ref 39–117)
BUN: 13 mg/dL (ref 6–23)
CALCIUM: 9.5 mg/dL (ref 8.4–10.5)
CO2: 29 mEq/L (ref 19–32)
CREATININE: 0.74 mg/dL (ref 0.40–1.20)
Chloride: 103 mEq/L (ref 96–112)
GFR: 85.83 mL/min (ref >60.00)
Glucose, Bld: 92 mg/dL (ref 70–99)
POTASSIUM: 4.2 meq/L (ref 3.5–5.1)
Sodium: 140 mEq/L (ref 135–145)
Total Bilirubin: 0.6 mg/dL (ref 0.2–1.2)
Total Protein: 7 g/dL (ref 6.0–8.3)

## 2017-12-04 LAB — TSH: TSH: 0.74 u[IU]/mL (ref 0.35–4.50)

## 2017-12-04 NOTE — Patient Instructions (Signed)
Please return in 12 months for your annual complete physical; please come fasting. OR sooner if your symptoms persist.  I will release your lab results to you on your MyChart account with further instructions. Please reply with any questions.   Please go to our [facility] [place] office to get your xrays done. You can walk in M-F between 8am and 5pm. Tell them you are there for xrays ordered by me. They will send me the results, then I will let you know the results with instructions.   Address: [address], [place], [place] [phone]  (office [address] at [address] at [address]; from here, turn left onto [address] to [address], turn right and go for a mile or so, office will be on left across form [address] )  It was a pleasure meeting you today! Thank you for choosing us to meet your healthcare needs! I truly look forward to working with you. If you have any questions or concerns, please send me a message via Mychart or call the office at [phone].

## 2017-12-04 NOTE — Progress Notes (Signed)
Subjective  CC:  Chief Complaint  Patient presents with  . Establish Care    Transfer from Dr. Caryl Never   . Cough    Patient states that she has been battling sickness for a few weeks, chest tightening    HPI: Ann Henderson is a 57 y.o. female who presents to [facility] today to establish care with me as a new patient.   She has the following concerns or needs:  Overall healthy female who has had most recent care from GYN; Female wellness exam was last year with nl pap. History of possible lymes disease, mild hydrocephalus evaluated by neuro and NS now asymtpomatic, and arthritis. 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 She eats vegetarian diet and runs regularly.   C/o 2 weeks of not feeling well. Started with mild ST at work and fatigue. No fever, body aches or GI sxs. Then over last week, c/o chest tightness while running and 48 hours of PND and cough with minimal sneezing. No sinus pain but has had congestion. No joint pain. No rash. No abdominal pain. No BMs and no urinary sxs. Does report pleuritic cp with deep breathing intermittently. She admits she's a little apathetic but no h/o depression. Fatigue - has gone to bed early several days recently. Fh with PE in mother and nephew: both family members died from large PE. Pt reports negative factor V leiden eval in past. She has no h/o clots. Denies calf pain or LE swelling. Doesn't feel sob. No palpitations   HM: would like cpe with lab work.   We updated and reviewed the patient's past history in detail and it is documented below.  Patient Active Problem List   Diagnosis Date Noted  . Hyperplastic colon polyp 12/04/2017  . Hydrocephalus with sparing of fourth ventricle due to aqueductal stenosis (HCC) 12/04/2017  . DJD (degenerative joint disease) of cervical spine 12/04/2017  . Seasonal allergic rhinitis 12/04/2017  . Vegetarian diet and gluten free 12/04/2017   . Umbilical hernia 12/04/2017  . H/o Lyme disease 12/04/2017   Health Maintenance  Topic Date Due  . Hepatitis C Screening  [birth year]  . HIV Screening  06/16/1975  . INFLUENZA VACCINE  04/01/2018  . DEXA SCAN  12/17/2018  . MAMMOGRAM  12/31/2018  . TETANUS/TDAP  12/27/2019  . PAP SMEAR  12/31/2019  . COLONOSCOPY  07/14/2023   Immunization History  Administered Date(s) Administered  . Td 09/02/1999, 12/26/2009   No outpatient medications have been marked as taking for the 12/04/17 encounter (Office Visit) with Willow Ora, MD.    Allergies: Patient has No Known Allergies. Past Medical History Patient  has a past medical history of H/o Lyme disease (12/04/2017), Hydrocephalus with sparing of fourth ventricle due to aqueductal stenosis (HCC) (12/04/2017), and Vegetarian diet and gluten free (12/04/2017). Past Surgical History Patient  has a past surgical history that includes Microdiscectomy lumbar; dermoid tumor; Oophorectomy; and Cesarean section. Family History: Patient family history includes Early death in her mother; Healthy in her daughter, daughter, father, and son; Heart disease in her maternal grandfather, paternal grandfather, and paternal grandmother; Lung cancer in her mother; Mental illness in her sister; Pulmonary embolism in her mother. Social History:  Patient  reports that she quit smoking about 27 years ago. She has never used smokeless tobacco. She reports that she drinks alcohol. She reports that she does not use drugs.  Review of Systems: Constitutional: negative for fever or malaise  Ophthalmic: negative for photophobia, double vision or loss of vision Cardiovascular: negative for chest pain, dyspnea on exertion, or new LE swelling Respiratory: negative for SOB or persistent cough Gastrointestinal: negative for abdominal pain, change in bowel habits or melena Genitourinary: negative for dysuria or gross hematuria Musculoskeletal: negative for new gait  disturbance or muscular weakness Integumentary: negative for new or persistent rashes Neurological: negative for TIA or stroke symptoms Psychiatric: negative for SI or delusions Allergic/Immunologic: negative for hives  Patient Care Team    Relationship Specialty Notifications Start End  Willow Ora, MD PCP - General Family Medicine  12/04/17   Huel Cote, MD Consulting Physician Obstetrics and Gynecology  12/04/17   Vida Rigger, MD Consulting Physician Gastroenterology  12/04/17     Objective  Vitals: BP 104/60   Pulse (!) 59   Temp 97.6 F (36.4 C)   Ht 5' 5.75" (1.67 m)   Wt 128 lb 12.8 oz (58.4 kg)   SpO2 97%   BMI 20.95 kg/m  General:  Well developed, well nourished, no acute distress , thin Psych:  Alert and oriented,normal mood and affect HEENT:  Normocephalic, atraumatic, non-icteric sclera, PERRL, nasal mucosa is inflamed with clear rhinorrhea, oropharynx is without mass or exudate, supple neck without adenopathy, mass or thyromegaly Cardiovascular:  RRR without gallop, rub or murmur, nondisplaced PMI Respiratory:  Good breath sounds bilaterally, CTAB with normal respiratory effort Gastrointestinal: normal bowel sounds, soft, non-tender, no noted masses. No HSM MSK: no deformities, contusions. Joints are without erythema or swelling Skin:  Warm, no rashes or suspicious lesions noted Neurologic:    Mental status is normal. Normal gait  Assessment  1. Annual physical exam   2. Hyperplastic colonic polyp, unspecified part of colon   3. Hydrocephalus with sparing of fourth ventricle due to aqueductal stenosis (HCC)   4. Pleuritic chest pain      Plan  Female Wellness Visit:  Age appropriate Health Maintenance and Prevention measures were discussed with patient. Included topics are cancer screening recommendations, ways to keep healthy (see AVS) including dietary and exercise recommendations, regular eye and dental care, use of seat belts, and avoidance of moderate  alcohol use and tobacco use.   BMI: discussed patient's BMI and encouraged positive lifestyle modifications to help get to or maintain a target BMI.  HM needs and immunizations were addressed and ordered. See below for orders. See HM and immunization section for updates.  Routine labs and screening tests ordered including cmp, cbc and lipids where appropriate.  Discussed recommendations regarding Vit D and calcium supplementation (see AVS)  Pleuritic cp and cough: most c/w viral etiology or allergies vs other: check labs and cxr. Given FH of clots, would consider thromboembolism. Consider spiral Ct if worsens. To ER for worsening sxs or SOB> f/u here in 1-2 weeks if sxs persist. Start oral antihistamine and decongestant. Rest and hydrate over weekend.   Follow up:  12 months for cpe; sooner if sx persist  Commons side effects, risks, benefits, and alternatives for medications and treatment plan prescribed today were discussed, and the patient expressed understanding of the given instructions. Patient is instructed to call or message via MyChart if he/she has any questions or concerns regarding our treatment plan. No barriers to understanding were identified. We discussed Red Flag symptoms and signs in detail. Patient expressed understanding regarding what to do in case of urgent or emergency type symptoms.   Medication list was reconciled, printed and provided to the patient in AVS. Patient instructions  and summary information was reviewed with the patient as documented in the AVS. This note was prepared with assistance of Dragon voice recognition software. Occasional wrong-word or sound-a-like substitutions may have occurred due to the inherent limitations of voice recognition software  Orders Placed This Encounter  Procedures  . DG Chest 2 View  . CBC with Differential/Platelet  . Comprehensive metabolic panel  . Lipid panel  . HIV antibody  . Hepatitis C antibody  . TSH   No orders of  the defined types were placed in this encounter.

## 2017-12-06 LAB — HEPATITIS C ANTIBODY
HEP C AB: NONREACTIVE
SIGNAL TO CUT-OFF: 0.01 (ref <1.00)

## 2017-12-06 LAB — HIV ANTIBODY (ROUTINE TESTING W REFLEX): HIV 1&2 Ab, 4th Generation: NONREACTIVE

## 2018-01-05 DIAGNOSIS — Z130 Encounter for screening for diseases of the blood and blood-forming organs and certain disorders involving the immune mechanism: Secondary | ICD-10-CM | POA: Diagnosis not present

## 2018-01-05 DIAGNOSIS — Z1389 Encounter for screening for other disorder: Secondary | ICD-10-CM | POA: Diagnosis not present

## 2018-01-05 DIAGNOSIS — Z6821 Body mass index (BMI) 21.0-21.9, adult: Secondary | ICD-10-CM | POA: Diagnosis not present

## 2018-01-05 DIAGNOSIS — Z124 Encounter for screening for malignant neoplasm of cervix: Secondary | ICD-10-CM | POA: Diagnosis not present

## 2018-01-05 DIAGNOSIS — Z01419 Encounter for gynecological examination (general) (routine) without abnormal findings: Secondary | ICD-10-CM | POA: Diagnosis not present

## 2018-01-11 DIAGNOSIS — Z1231 Encounter for screening mammogram for malignant neoplasm of breast: Secondary | ICD-10-CM | POA: Diagnosis not present

## 2018-05-25 DIAGNOSIS — L821 Other seborrheic keratosis: Secondary | ICD-10-CM | POA: Diagnosis not present

## 2018-05-25 DIAGNOSIS — L738 Other specified follicular disorders: Secondary | ICD-10-CM | POA: Diagnosis not present

## 2018-05-25 DIAGNOSIS — D1801 Hemangioma of skin and subcutaneous tissue: Secondary | ICD-10-CM | POA: Diagnosis not present

## 2019-03-29 ENCOUNTER — Ambulatory Visit (INDEPENDENT_AMBULATORY_CARE_PROVIDER_SITE_OTHER): Payer: BC Managed Care – PPO | Admitting: Family Medicine

## 2019-03-29 ENCOUNTER — Encounter: Payer: Self-pay | Admitting: Family Medicine

## 2019-03-29 ENCOUNTER — Other Ambulatory Visit: Payer: Self-pay

## 2019-03-29 VITALS — BP 104/66 | HR 65 | Temp 97.6°F | Resp 14 | Ht 65.5 in | Wt 130.4 lb

## 2019-03-29 DIAGNOSIS — Z0000 Encounter for general adult medical examination without abnormal findings: Secondary | ICD-10-CM

## 2019-03-29 DIAGNOSIS — Q030 Malformations of aqueduct of Sylvius: Secondary | ICD-10-CM | POA: Diagnosis not present

## 2019-03-29 DIAGNOSIS — M8580 Other specified disorders of bone density and structure, unspecified site: Secondary | ICD-10-CM

## 2019-03-29 DIAGNOSIS — Z1239 Encounter for other screening for malignant neoplasm of breast: Secondary | ICD-10-CM

## 2019-03-29 DIAGNOSIS — Z789 Other specified health status: Secondary | ICD-10-CM | POA: Diagnosis not present

## 2019-03-29 LAB — COMPREHENSIVE METABOLIC PANEL
ALT: 14 U/L (ref 0–35)
AST: 17 U/L (ref 0–37)
Albumin: 4.6 g/dL (ref 3.5–5.2)
Alkaline Phosphatase: 61 U/L (ref 39–117)
BUN: 12 mg/dL (ref 6–23)
CO2: 30 mEq/L (ref 19–32)
Calcium: 9.8 mg/dL (ref 8.4–10.5)
Chloride: 102 mEq/L (ref 96–112)
Creatinine, Ser: 0.8 mg/dL (ref 0.40–1.20)
GFR: 73.47 mL/min (ref >60.00)
Glucose, Bld: 89 mg/dL (ref 70–99)
Potassium: 4.8 mEq/L (ref 3.5–5.1)
Sodium: 139 mEq/L (ref 135–145)
Total Bilirubin: 0.7 mg/dL (ref 0.2–1.2)
Total Protein: 6.8 g/dL (ref 6.0–8.3)

## 2019-03-29 LAB — LIPID PANEL
Cholesterol: 196 mg/dL (ref 0–200)
HDL: 69.7 mg/dL (ref >39.00)
LDL Cholesterol: 114 mg/dL — ABNORMAL HIGH (ref 0–99)
NonHDL: 126.64
Total CHOL/HDL Ratio: 3
Triglycerides: 65 mg/dL (ref 0.0–149.0)
VLDL: 13 mg/dL (ref 0.0–40.0)

## 2019-03-29 LAB — CBC WITH DIFFERENTIAL/PLATELET
Basophils Absolute: 0 10*3/uL (ref 0.0–0.1)
Basophils Relative: 0.8 % (ref 0.0–3.0)
Eosinophils Absolute: 0 10*3/uL (ref 0.0–0.7)
Eosinophils Relative: 1.1 % (ref 0.0–5.0)
HCT: 42 % (ref 36.0–46.0)
Hemoglobin: 14.1 g/dL (ref 12.0–15.0)
Lymphocytes Relative: 36.7 % (ref 12.0–46.0)
Lymphs Abs: 1.5 10*3/uL (ref 0.7–4.0)
MCHC: 33.5 g/dL (ref 30.0–36.0)
MCV: 95.5 fl (ref 78.0–100.0)
Monocytes Absolute: 0.2 10*3/uL (ref 0.1–1.0)
Monocytes Relative: 5.2 % (ref 3.0–12.0)
Neutro Abs: 2.4 10*3/uL (ref 1.4–7.7)
Neutrophils Relative %: 56.2 % (ref 43.0–77.0)
Platelets: 139 10*3/uL — ABNORMAL LOW (ref 150.0–400.0)
RBC: 4.4 Mil/uL (ref 3.87–5.11)
RDW: 12.8 % (ref 11.5–15.5)
WBC: 4.2 10*3/uL (ref 4.0–10.5)

## 2019-03-29 LAB — TSH: TSH: 1.12 u[IU]/mL (ref 0.35–4.50)

## 2019-03-29 LAB — VITAMIN D 25 HYDROXY (VIT D DEFICIENCY, FRACTURES): VITD: 32.9 ng/mL (ref 30.00–100.00)

## 2019-03-29 LAB — VITAMIN B12: Vitamin B-12: 760 pg/mL (ref 211–911)

## 2019-03-29 NOTE — Patient Instructions (Addendum)
Please return in 12 months for your annual complete physical; please come fasting.  I will release your lab results to you on your MyChart account with further instructions. Please reply with any questions.   We will call you with information regarding your referral appointment. Mammogram at the breast center.  If you do not hear from Ann Henderson within the next 2 weeks, please let me know. It can take 1-2 weeks to get appointments set up with the specialists.   If you have any questions or concerns, please don't hesitate to send me a message via MyChart or call the office at [phone]. Thank you for visiting with Ann Henderson today! It's our pleasure caring for you.   Preventive Care 59-78 Years Old, Female Preventive care refers to visits with your health care provider and lifestyle choices that can promote health and wellness. This includes:  A yearly physical exam. This may also be called an annual well check.  Regular dental visits and eye exams.  Immunizations.  Screening for certain conditions.  Healthy lifestyle choices, such as eating a healthy diet, getting regular exercise, not using drugs or products that contain nicotine and tobacco, and limiting alcohol use. What can I expect for my preventive care visit? Physical exam Your health care provider will check your:  Height and weight. This may be used to calculate body mass index (BMI), which tells if you are at a healthy weight.  Heart rate and blood pressure.  Skin for abnormal spots. Counseling Your health care provider may ask you questions about your:  Alcohol, tobacco, and drug use.  Emotional well-being.  Home and relationship well-being.  Sexual activity.  Eating habits.  Work and work Statistician.  Method of birth control.  Menstrual cycle.  Pregnancy history. What immunizations do I need?  Influenza (flu) vaccine  This is recommended every year. Tetanus, diphtheria, and pertussis (Tdap) vaccine  You may  need a Td booster every 10 years. Varicella (chickenpox) vaccine  You may need this if you have not been vaccinated. Zoster (shingles) vaccine  You may need this after age 59. Measles, mumps, and rubella (MMR) vaccine  You may need at least one dose of MMR if you were born in 1957 or later. You may also need a second dose. Pneumococcal conjugate (PCV13) vaccine  You may need this if you have certain conditions and were not previously vaccinated. Pneumococcal polysaccharide (PPSV23) vaccine  You may need one or two doses if you smoke cigarettes or if you have certain conditions. Meningococcal conjugate (MenACWY) vaccine  You may need this if you have certain conditions. Hepatitis A vaccine  You may need this if you have certain conditions or if you travel or work in places where you may be exposed to hepatitis A. Hepatitis B vaccine  You may need this if you have certain conditions or if you travel or work in places where you may be exposed to hepatitis B. Haemophilus influenzae type b (Hib) vaccine  You may need this if you have certain conditions. Human papillomavirus (HPV) vaccine  If recommended by your health care provider, you may need three doses over 6 months. You may receive vaccines as individual doses or as more than one vaccine together in one shot (combination vaccines). Talk with your health care provider about the risks and benefits of combination vaccines. What tests do I need? Blood tests  Lipid and cholesterol levels. These may be checked every 5 years, or more frequently if you are over 50 years  old.  Hepatitis C test.  Hepatitis B test. Screening  Lung cancer screening. You may have this screening every year starting at age 82 if you have a 30-pack-year history of smoking and currently smoke or have quit within the past 15 years.  Colorectal cancer screening. All adults should have this screening starting at age 59 and continuing until age 60. Your  health care provider may recommend screening at age 29 if you are at increased risk. You will have tests every 1-10 years, depending on your results and the type of screening test.  Diabetes screening. This is done by checking your blood sugar (glucose) after you have not eaten for a while (fasting). You may have this done every 1-3 years.  Mammogram. This may be done every 1-2 years. Talk with your health care provider about when you should start having regular mammograms. This may depend on whether you have a family history of breast cancer.  BRCA-related cancer screening. This may be done if you have a family history of breast, ovarian, tubal, or peritoneal cancers.  Pelvic exam and Pap test. This may be done every 3 years starting at age 24. Starting at age 59, this may be done every 5 years if you have a Pap test in combination with an HPV test. Other tests  Sexually transmitted disease (STD) testing.  Bone density scan. This is done to screen for osteoporosis. You may have this scan if you are at high risk for osteoporosis. Follow these instructions at home: Eating and drinking  Eat a diet that includes fresh fruits and vegetables, whole grains, lean protein, and low-fat dairy.  Take vitamin and mineral supplements as recommended by your health care provider.  Do not drink alcohol if: ? Your health care provider tells you not to drink. ? You are pregnant, may be pregnant, or are planning to become pregnant.  If you drink alcohol: ? Limit how much you have to 0-1 drink a day. ? Be aware of how much alcohol is in your drink. In the [place] one drink equals one 12 oz bottle of beer (355 mL), one 5 oz glass of wine (148 mL), or one 1 oz glass of hard liquor (44 mL). Lifestyle  Take daily care of your teeth and gums.  Stay active. Exercise for at least 30 minutes on 5 or more days each week.  Do not use any products that contain nicotine or tobacco, such as cigarettes, e-cigarettes,  and chewing tobacco. If you need help quitting, ask your health care provider.  If you are sexually active, practice safe sex. Use a condom or other form of birth control (contraception) in order to prevent pregnancy and STIs (sexually transmitted infections).  If told by your health care provider, take low-dose aspirin daily starting at age 11. What's next?  Visit your health care provider once a year for a well check visit.  Ask your health care provider how often you should have your eyes and teeth checked.  Stay up to date on all vaccines. This information is not intended to replace advice given to you by your health care provider. Make sure you discuss any questions you have with your health care provider. Document Released: 09/14/2015 Document Revised: 04/29/2018 Document Reviewed: 04/29/2018 Elsevier Patient Education  2020 [employer].

## 2019-03-29 NOTE — Progress Notes (Signed)
Subjective  Chief Complaint  Patient presents with  . Annual Exam    Fasting    HPI: Ann Henderson is a 58 y.o. female who presents to [facility] today for a Female Wellness Visit. She also has the concerns and/or needs as listed above in the chief complaint. These will be addressed in addition to the Health Maintenance Visit.   Wellness Visit: annual visit with health maintenance review and exam without Pap   HM: due mammogram. Healthy lifestyle. No concerns. Last dexa 2018 with mild osteopenia   Assessment  1. Annual physical exam   2. Hydrocephalus with sparing of fourth ventricle due to aqueductal stenosis (HCC)   3. Vegetarian diet and gluten free   4. Osteopenia, unspecified location   5. Breast cancer screening      Plan  Female Wellness Visit:  Age appropriate Health Maintenance and Prevention measures were discussed with patient. Included topics are cancer screening recommendations, ways to keep healthy (see AVS) including dietary and exercise recommendations, regular eye and dental care, use of seat belts, and avoidance of moderate alcohol use and tobacco use. Mammogram ordered  BMI: discussed patient's BMI and encouraged positive lifestyle modifications to help get to or maintain a target BMI.  HM needs and immunizations were addressed and ordered. See below for orders. See HM and immunization section for updates.  Routine labs and screening tests ordered including cmp, cbc and lipids where appropriate.  Discussed recommendations regarding Vit D and calcium supplementation (see AVS)  Chronic disease management visit and/or acute problem visit:  Osteopenia: defer repeat for 1-3 years. Continue ca/D and weight bearing activities.   Screen for vit b12 deficiency due to vegetarian diet.   Neuro is stable.  Follow up: Return in about 1 year (around 03/28/2020) for complete physical.  Orders Placed This Encounter  Procedures  . MM  DIGITAL SCREENING BILATERAL  . Comprehensive metabolic panel  . CBC with Differential/Platelet  . Lipid panel  . TSH  . Vitamin B12  . VITAMIN D 25 Hydroxy (Vit-D Deficiency, Fractures)   No orders of the defined types were placed in this encounter.     Lifestyle: Body mass index is 21.37 kg/m. Wt Readings from Last 3 Encounters:  03/29/19 130 lb 6.4 oz (59.1 kg)  12/04/17 128 lb 12.8 oz (58.4 kg)  06/02/14 130 lb (59 kg)      Patient Active Problem List   Diagnosis Date Noted  . Hyperplastic colon polyp 12/04/2017  . DJD (degenerative joint disease) of cervical spine 12/04/2017  . Seasonal allergic rhinitis 12/04/2017  . Vegetarian diet and gluten free 12/04/2017  . Umbilical hernia 56/81/2751  . H/o Lyme disease 12/04/2017  . Hydrocephalus ([place]) 03/02/2012    Evaluated by neurology and neurosurgeon    Health Maintenance  Topic Date Due  . MAMMOGRAM  12/30/2017  . INFLUENZA VACCINE  04/02/2019  . TETANUS/TDAP  12/27/2019  . PAP SMEAR-Modifier  12/31/2019  . DEXA SCAN  12/16/2021  . COLONOSCOPY  07/14/2023  . Hepatitis C Screening  Completed  . HIV Screening  Completed   Immunization History  Administered Date(s) Administered  . Td 09/02/1999, 12/26/2009   We updated and reviewed the patient's past history in detail and it is documented below. Allergies: Patient has No Known Allergies. Past Medical History Patient  has a past medical history of H/o Lyme disease (12/04/2017), Hydrocephalus with sparing of fourth ventricle due to aqueductal stenosis ([place]) (12/04/2017), and Vegetarian diet and gluten  free (12/04/2017). Past Surgical History Patient  has a past surgical history that includes Microdiscectomy lumbar; dermoid tumor; Oophorectomy; and Cesarean section. Family History: Patient family history includes Early death in her mother; Healthy in her daughter, daughter, father, and son; Heart disease in her maternal grandfather, paternal grandfather, and paternal  grandmother; Lung cancer in her mother; Mental illness in her sister; Pulmonary embolism in her mother. Social History:  Patient  reports that she quit smoking about 29 years ago. She has never used smokeless tobacco. She reports current alcohol use. She reports that she does not use drugs.  Review of Systems: Constitutional: negative for fever or malaise Ophthalmic: negative for photophobia, double vision or loss of vision Cardiovascular: negative for chest pain, dyspnea on exertion, or new LE swelling Respiratory: negative for SOB or persistent cough Gastrointestinal: negative for abdominal pain, change in bowel habits or melena Genitourinary: negative for dysuria or gross hematuria, no abnormal uterine bleeding or disharge Musculoskeletal: negative for new gait disturbance or muscular weakness Integumentary: negative for new or persistent rashes, no breast lumps Neurological: negative for TIA or stroke symptoms Psychiatric: negative for SI or delusions Allergic/Immunologic: negative for hives  Patient Care Team    Relationship Specialty Notifications Start End  Willow OraAndy, Camille L, MD PCP - General Family Medicine  12/04/17   Huel Coteichardson, Kathy, MD Consulting Physician Obstetrics and Gynecology  12/04/17   Vida RiggerMagod, Marc, MD Consulting Physician Gastroenterology  12/04/17   Donzetta StarchJones, Drew, MD Consulting Physician Dermatology  03/29/19     Objective  Vitals: BP 104/66   Pulse 65   Temp 97.6 F (36.4 C) (Oral)   Resp 14   Ht 5' 5.5" (1.664 m)   Wt 130 lb 6.4 oz (59.1 kg)   SpO2 97%   BMI 21.37 kg/m  General:  Well developed, well nourished, no acute distress  Psych:  Alert and orientedx3,normal mood and affect HEENT:  Normocephalic, atraumatic, non-icteric sclera, PERRL, oropharynx is clear without mass or exudate, supple neck without adenopathy, mass or thyromegaly Cardiovascular:  Normal S1, S2, RRR without gallop, rub or murmur, nondisplaced PMI Respiratory:  Good breath sounds bilaterally,  CTAB with normal respiratory effort Gastrointestinal: normal bowel sounds, soft, non-tender, no noted masses. No HSM MSK: no deformities, contusions. Joints are without erythema or swelling. Spine and CVA region are nontender Skin:  Warm, no rashes or suspicious lesions noted Neurologic:    Mental status is normal. CN 2-11 are normal. Gross motor and sensory exams are normal. Normal gait. No tremor Breast Exam: No mass, skin retraction or nipple discharge is appreciated in either breast. No axillary adenopathy. Fibrocystic changes are not noted .    Commons side effects, risks, benefits, and alternatives for medications and treatment plan prescribed today were discussed, and the patient expressed understanding of the given instructions. Patient is instructed to call or message via MyChart if he/she has any questions or concerns regarding our treatment plan. No barriers to understanding were identified. We discussed Red Flag symptoms and signs in detail. Patient expressed understanding regarding what to do in case of urgent or emergency type symptoms.   Medication list was reconciled, printed and provided to the patient in AVS. Patient instructions and summary information was reviewed with the patient as documented in the AVS. This note was prepared with assistance of Dragon voice recognition software. Occasional wrong-word or sound-a-like substitutions may have occurred due to the inherent limitations of voice recognition software

## 2019-04-07 ENCOUNTER — Other Ambulatory Visit: Payer: Self-pay | Admitting: Family Medicine

## 2019-04-07 DIAGNOSIS — Z1239 Encounter for other screening for malignant neoplasm of breast: Secondary | ICD-10-CM

## 2019-05-24 ENCOUNTER — Ambulatory Visit
Admission: RE | Admit: 2019-05-24 | Discharge: 2019-05-24 | Disposition: A | Discharge status: Home / Self Care | Payer: BC Managed Care – PPO | Source: Ambulatory Visit | Attending: Family Medicine | Admitting: Family Medicine

## 2019-05-24 ENCOUNTER — Other Ambulatory Visit: Payer: Self-pay

## 2019-05-24 DIAGNOSIS — Z1231 Encounter for screening mammogram for malignant neoplasm of breast: Secondary | ICD-10-CM | POA: Diagnosis not present

## 2019-05-24 DIAGNOSIS — Z1239 Encounter for other screening for malignant neoplasm of breast: Secondary | ICD-10-CM

## 2019-05-25 ENCOUNTER — Other Ambulatory Visit: Payer: Self-pay | Admitting: Family Medicine

## 2019-05-25 DIAGNOSIS — R928 Other abnormal and inconclusive findings on diagnostic imaging of breast: Secondary | ICD-10-CM

## 2019-05-31 ENCOUNTER — Ambulatory Visit
Admission: RE | Admit: 2019-05-31 | Discharge: 2019-05-31 | Disposition: A | Discharge status: Home / Self Care | Payer: BC Managed Care – PPO | Source: Ambulatory Visit | Attending: Family Medicine | Admitting: Family Medicine

## 2019-05-31 ENCOUNTER — Ambulatory Visit: Payer: BC Managed Care – PPO

## 2019-05-31 ENCOUNTER — Other Ambulatory Visit: Payer: Self-pay

## 2019-05-31 DIAGNOSIS — R922 Inconclusive mammogram: Secondary | ICD-10-CM | POA: Diagnosis not present

## 2019-05-31 DIAGNOSIS — R928 Other abnormal and inconclusive findings on diagnostic imaging of breast: Secondary | ICD-10-CM

## 2019-07-25 DIAGNOSIS — L820 Inflamed seborrheic keratosis: Secondary | ICD-10-CM | POA: Diagnosis not present

## 2020-02-26 DIAGNOSIS — M79601 Pain in right arm: Secondary | ICD-10-CM | POA: Diagnosis not present

## 2020-02-26 DIAGNOSIS — S29001A Unspecified injury of muscle and tendon of front wall of thorax, initial encounter: Secondary | ICD-10-CM | POA: Diagnosis not present

## 2020-02-26 DIAGNOSIS — S4991XA Unspecified injury of right shoulder and upper arm, initial encounter: Secondary | ICD-10-CM | POA: Diagnosis not present

## 2020-02-26 DIAGNOSIS — M7989 Other specified soft tissue disorders: Secondary | ICD-10-CM | POA: Diagnosis not present

## 2020-02-26 DIAGNOSIS — X58XXXA Exposure to other specified factors, initial encounter: Secondary | ICD-10-CM | POA: Diagnosis not present

## 2020-02-26 DIAGNOSIS — R079 Chest pain, unspecified: Secondary | ICD-10-CM | POA: Diagnosis not present

## 2020-03-14 DIAGNOSIS — M25511 Pain in right shoulder: Secondary | ICD-10-CM | POA: Diagnosis not present

## 2020-04-03 ENCOUNTER — Encounter: Payer: BC Managed Care – PPO | Admitting: Family Medicine

## 2020-06-27 ENCOUNTER — Telehealth: Payer: Self-pay

## 2020-06-27 NOTE — Telephone Encounter (Signed)
FYI, patient scheduled for tomorrow 

## 2020-06-27 NOTE — Telephone Encounter (Signed)
Nurse Assessment Nurse: Wyn Quaker, RN, Marylene Land Date/Time (Eastern Time): 06/26/2020 11:27:07 AM Confirm and document reason for call. If symptomatic, describe symptoms. ---Caller stated that she had intermittent racing heart, lightheadedness, and dizziness. She has no s/s now. She stated that it happened at night and wanted to know when she should follow up. Does the patient have any new or worsening symptoms? ---Yes Will a triage be completed? ---Yes Related visit to physician within the last 2 weeks? ---No Does the PT have any chronic conditions? (i.e. diabetes, asthma, this includes High risk factors for pregnancy, etc.) ---No Is this a behavioral health or substance abuse call? ---No Guidelines Guideline Title Affirmed Question Affirmed Notes Nurse Date/Time (Eastern Time) Dizziness - Lightheadedness [1] MODERATE dizziness (e.g., interferes with normal activities) AND [2] has NOT been evaluated by physician for this (Exception: dizziness caused by heat exposure, sudden standing, or poor fluid intake) Dew, RN, Marylene Land 06/26/2020 11:28:37 AM Disp. Time Lamount Cohen Time) Disposition Final User 06/26/2020 11:32:50 AM See PCP within 24 Hours Yes Dew, RN, Marylene Land PLEASE NOTE: All timestamps contained within this report are represented as [place] Standard Time. CONFIDENTIALTY NOTICE: This fax transmission is intended only for the addressee. It contains information that is legally privileged, confidential or otherwise protected from use or disclosure. If you are not the intended recipient, you are strictly prohibited from reviewing, disclosing, copying using or disseminating any of this information or taking any action in reliance on or regarding this information. If you have received this fax in error, please notify us immediately by telephone so that we can arrange for its return to [place]. Phone: [phone], Toll-Free: [phone], Fax: [phone] Page: 2 of 2 Call Id: [id number] Caller  Disagree/Comply Comply Caller Understands Yes PreDisposition Did not know what to do Care Advice Given Per Guideline SEE PCP WITHIN 24 HOURS: * IF OFFICE WILL BE OPEN: You need to be examined within the next 24 hours. Call your doctor (or NP/PA) when the office opens and make an appointment. CALL BACK IF: * Passes out (faints) * You become worse CARE ADVICE given per Dizziness (Adult) guideline. Comments User: Gifford Shave, RN Date/Time Lamount Cohen Time): 06/26/2020 11:33:33 AM Transferred caller to the main office # to make an app't. Referrals REFERRED TO PCP OFFIC

## 2020-06-28 ENCOUNTER — Other Ambulatory Visit: Payer: Self-pay | Admitting: Family Medicine

## 2020-06-28 ENCOUNTER — Other Ambulatory Visit: Payer: Self-pay

## 2020-06-28 ENCOUNTER — Ambulatory Visit: Payer: BC Managed Care – PPO | Admitting: Family Medicine

## 2020-06-28 ENCOUNTER — Encounter: Payer: Self-pay | Admitting: Family Medicine

## 2020-06-28 VITALS — BP 138/78 | HR 72 | Temp 97.6°F | Wt 131.2 lb

## 2020-06-28 DIAGNOSIS — Z1231 Encounter for screening mammogram for malignant neoplasm of breast: Secondary | ICD-10-CM

## 2020-06-28 DIAGNOSIS — R55 Syncope and collapse: Secondary | ICD-10-CM | POA: Diagnosis not present

## 2020-06-28 DIAGNOSIS — R079 Chest pain, unspecified: Secondary | ICD-10-CM

## 2020-06-28 NOTE — Patient Instructions (Addendum)
Please return for your annual complete physical; please come fasting. You are due for a pap smear and mammogram.   I will release your lab results to you on your MyChart account with further instructions. Please reply with any questions.  We will call you to get you scheduled for a treadmill stress test.   If you have any questions or concerns, please don't hesitate to send me a message via MyChart or call the office at [phone]. Thank you for visiting with [place] today! It's our pleasure caring for you.  Please schedule a mammogram.   [x]   [facility]      [address] [place], [place]        [place]           Nonspecific Chest Pain, Adult Chest pain can be caused by many different conditions. It can be caused by a condition that is life-threatening and requires treatment right away. It can also be caused by something that is not life-threatening. If you have chest pain, it can be hard to know the difference, so it is important to get help right away to make sure that you do not have a serious condition. Some life-threatening causes of chest pain include:  Heart attack.  A tear in the body's main blood vessel (aortic dissection).  Inflammation around your heart (pericarditis).  A problem in the lungs, such as a blood clot (pulmonary embolism) or a collapsed lung (pneumothorax). Some non life-threatening causes of chest pain include:  Heartburn.  Anxiety or stress.  Damage to the bones, muscles, and cartilage that make up your chest wall.  Pneumonia or bronchitis.  Shingles infection (varicella-zoster virus). Chest pain can feel like:  Pain or discomfort on the surface of your chest or deep in your chest.  Crushing, pressure, aching, or squeezing pain.  Burning or tingling.  Dull or sharp pain that is worse when you move, cough, or take a deep breath.  Pain or discomfort that is also felt in your back, neck, jaw, shoulder, or arm,  or pain that spreads to any of these areas. Your chest pain may come and go. It may also be constant. Your health care provider will do lab tests and other studies to find the cause of your pain. Treatment will depend on the cause of your chest pain. Follow these instructions at home: Medicines  Take over-the-counter and prescription medicines only as told by your health care provider.  If you were prescribed an antibiotic, take it as told by your health care provider. Do not stop taking the antibiotic even if you start to feel better. Lifestyle   Rest as directed by your health care provider.  Do not use any products that contain nicotine or tobacco, such as cigarettes and e-cigarettes. If you need help quitting, ask your health care provider.  Do not drink alcohol.  Make healthy lifestyle choices as recommended. These may include: ? Getting regular exercise. Ask your health care provider to suggest some activities that are safe for you. ? Eating a heart-healthy diet. This includes plenty of fresh fruits and vegetables, whole grains, low-fat (lean) protein, and low-fat dairy products. A dietitian can help you find healthy eating options. ? Maintaining a healthy weight. ? Managing any other health conditions you have, such as high blood pressure (hypertension) or diabetes. ? Reducing stress, such as with yoga or relaxation techniques. General instructions  Pay attention to any changes in  your symptoms. Tell your health care provider about them or any new symptoms.  Avoid any activities that cause chest pain.  Keep all follow-up visits as told by your health care provider. This is important. This includes visits for any further testing if your chest pain does not go away. Contact a health care provider if:  Your chest pain does not go away.  You feel depressed.  You have a fever. Get help right away if:  Your chest pain gets worse.  You have a cough that gets worse, or you  cough up blood.  You have severe pain in your abdomen.  You faint.  You have sudden, unexplained chest discomfort.  You have sudden, unexplained discomfort in your arms, back, neck, or jaw.  You have shortness of breath at any time.  You suddenly start to sweat, or your skin gets clammy.  You feel nausea or you vomit.  You suddenly feel lightheaded or dizzy.  You have severe weakness, or unexplained weakness or fatigue.  Your heart begins to beat quickly, or it feels like it is skipping beats. These symptoms may represent a serious problem that is an emergency. Do not wait to see if the symptoms will go away. Get medical help right away. Call your local emergency services (911 in the [place] Do not drive yourself to the hospital. Summary  Chest pain can be caused by a condition that is serious and requires urgent treatment. It may also be caused by something that is not life-threatening.  If you have chest pain, it is very important to see your health care provider. Your health care provider may do lab tests and other studies to find the cause of your pain.  Follow your health care provider's instructions on taking medicines, making lifestyle changes, and getting emergency treatment if symptoms become worse.  Keep all follow-up visits as told by your health care provider. This includes visits for any further testing if your chest pain does not go away. This information is not intended to replace advice given to you by your health care provider. Make sure you discuss any questions you have with your health care provider. Document Revised: 02/18/2018 Document Reviewed: 02/18/2018 Elsevier Patient Education  2020 [employer]  Syncope  Syncope refers to a condition in which a person temporarily loses consciousness. Syncope may also be called fainting or passing out. It is caused by a sudden decrease in blood flow to the brain. Even though most causes of syncope are not dangerous,  syncope can be a sign of a serious medical problem. Your health care provider may do tests to find the reason why you are having syncope. Signs that you may be about to faint include:  Feeling dizzy or light-headed.  Feeling nauseous.  Seeing all white or all black in your field of vision.  Having cold, clammy skin. If you faint, get medical help right away. Call your local emergency services (911 in the [place] Do not drive yourself to the hospital. Follow these instructions at home: Pay attention to any changes in your symptoms. Take these actions to stay safe and to help relieve your symptoms: Lifestyle  Do not drive, use machinery, or play sports until your health care provider says it is okay.  Do not drink alcohol.  Do not use any products that contain nicotine or tobacco, such as cigarettes and e-cigarettes. If you need help quitting, ask your health care provider.  Drink enough fluid to keep your urine pale yellow.  General instructions  Take over-the-counter and prescription medicines only as told by your health care provider.  If you are taking blood pressure or heart medicine, get up slowly and take several minutes to sit and then stand. This can reduce dizziness or light-headedness.  Have someone stay with you until you feel stable.  If you start to feel like you might faint, lie down right away and raise (elevate) your feet above the level of your heart. Breathe deeply and steadily. Wait until all the symptoms have passed.  Keep all follow-up visits as told by your health care provider. This is important. Get help right away if you:  Have a severe headache.  Faint once or repeatedly.  Have pain in your chest, abdomen, or back.  Have a very fast or irregular heartbeat (palpitations).  Have pain when you breathe.  Are bleeding from your mouth or rectum, or you have black or tarry stool.  Have a seizure.  Are confused.  Have trouble walking.  Have severe  weakness.  Have vision problems. These symptoms may represent a serious problem that is an emergency. Do not wait to see if your symptoms will go away. Get medical help right away. Call your local emergency services (911 in the [place] Do not drive yourself to the hospital. Summary  Syncope refers to a condition in which a person temporarily loses consciousness. It is caused by a sudden decrease in blood flow to the brain.  Signs that you may be about to faint include dizziness, feeling light-headed, feeling nauseous, sudden vision changes, or cold, clammy skin.  Although most causes of syncope are not dangerous, syncope can be a sign of a serious medical problem. If you faint, get medical help right away. This information is not intended to replace advice given to you by your health care provider. Make sure you discuss any questions you have with your health care provider. Document Revised: 07/31/2017 Document Reviewed: 07/27/2017 Elsevier Patient Education  2020 [employer].

## 2020-06-28 NOTE — Progress Notes (Signed)
Subjective  CC:  Chief Complaint  Patient presents with  . Dizziness    episode on Saturday while walking her dog, worsening over the past 6 months     HPI: Ann Henderson is a 59 y.o. female who presents to the office today to address the problems listed above in the chief complaint.  59 yo healthy female with episode of near syncope 5 days ago while walking her dogs. Sudden onset of feeling lightheaded while walking, had to stop and sit down; became diaphoretic. Admits to some heaviness in her chest but no sob or n/v. After sitting for minutes, she was able to get up and complete walk home but felt sluggish the remainder of the evening.   Has had an extremely difficult year having to care for 3 family members through the end of their lives. She is tired and sad but reports is coping ok. Has felt more tired than usual and was attributing it to the stress. However, admits hasn't been able to run easily (avid runner) due to fatigue or sob. No le edema. Ran 2 days ago, just 2-3 miles, but had to stop and walk a bit; this is unusual for her.   No h/o HTN, HLD, DM; nonsmoker and eats healthy. + FH of early vascular disease   Assessment  1. Chest pain, unspecified type   2. Near syncope      Plan   Chest pain and near syncope:  Needs eval.  Worrisome for possible CAD but could also be due to stress/dehydration or other. EKG is not remarkable. Check labs, treadmill stress test and limit exertion. Start ASA daily. To ER for CP or SOB> Patient understands and agrees with care plan.   Stress reaction: monitor mood   Follow up: cpe   Visit date not found  Orders Placed This Encounter  Procedures  . COMPLETE METABOLIC PANEL WITH GFR  . CBC with Differential/Platelet  . Lipid panel  . TSH  . EKG 12-Lead   No orders of the defined types were placed in this encounter.     I reviewed the patients updated PMH, FH, and SocHx.    Patient Active Problem List   Diagnosis Date Noted  .  Hyperplastic colon polyp 12/04/2017  . DJD (degenerative joint disease) of cervical spine 12/04/2017  . Seasonal allergic rhinitis 12/04/2017  . Vegetarian diet and gluten free 12/04/2017  . Umbilical hernia 12/04/2017  . H/o Lyme disease 12/04/2017  . Hydrocephalus (HCC) 03/02/2012   Current Meds  Medication Sig  . FISH OIL-KRILL OIL PO Take by mouth.  Marland Kitchen ibuprofen (ADVIL,MOTRIN) 200 MG tablet Take 200 mg by mouth every 6 (six) hours as needed.  . Multiple Vitamin (MULTIVITAMIN) tablet Take by mouth.    Allergies: Patient has No Known Allergies. Family History: Patient family history includes Early death in her mother; Healthy in her daughter, daughter, father, and son; Heart disease in her maternal grandfather, paternal grandfather, and paternal grandmother; Lung cancer in her mother; Mental illness in her sister; Pulmonary embolism in her mother. Social History:  Patient  reports that she quit smoking about 30 years ago. She has never used smokeless tobacco. She reports current alcohol use. She reports that she does not use drugs.  Review of Systems: Constitutional: Negative for fever malaise or anorexia Cardiovascular: negative for chest pain Respiratory: negative for SOB or persistent cough Gastrointestinal: negative for abdominal pain  Objective  Vitals: BP 138/78   Pulse 72   Temp 97.6 F (  36.4 C) (Temporal)   Wt 131 lb 3.2 oz (59.5 kg)   SpO2 98%   BMI 21.50 kg/m  neg orthostatics General: no acute distress , A&Ox3 HEENT: PEERL, conjunctiva normal, neck is supple Cardiovascular:  RRR without murmur or gallop. No edema Respiratory:  Good breath sounds bilaterally, CTAB with normal respiratory effort Skin:  Warm, no rashes  EKG: NSR with RSR in V1, otherwise normal and similar to 2010 comparison.     Commons side effects, risks, benefits, and alternatives for medications and treatment plan prescribed today were discussed, and the patient expressed understanding of  the given instructions. Patient is instructed to call or message via MyChart if he/she has any questions or concerns regarding our treatment plan. No barriers to understanding were identified. We discussed Red Flag symptoms and signs in detail. Patient expressed understanding regarding what to do in case of urgent or emergency type symptoms.   Medication list was reconciled, printed and provided to the patient in AVS. Patient instructions and summary information was reviewed with the patient as documented in the AVS. This note was prepared with assistance of Dragon voice recognition software. Occasional wrong-word or sound-a-like substitutions may have occurred due to the inherent limitations of voice recognition software  This visit occurred during the SARS-CoV-2 public health emergency.  Safety protocols were in place, including screening questions prior to the visit, additional usage of staff PPE, and extensive cleaning of exam room while observing appropriate contact time as indicated for disinfecting solutions.

## 2020-06-28 NOTE — Progress Notes (Signed)
  EKG: NSR with RSR in V1, otherwise normal and similar to 2010 comparison.

## 2020-06-29 ENCOUNTER — Ambulatory Visit
Admission: RE | Admit: 2020-06-29 | Discharge: 2020-06-29 | Disposition: A | Discharge status: Home / Self Care | Payer: BC Managed Care – PPO | Source: Ambulatory Visit | Attending: Family Medicine | Admitting: Family Medicine

## 2020-06-29 DIAGNOSIS — Z1231 Encounter for screening mammogram for malignant neoplasm of breast: Secondary | ICD-10-CM

## 2020-06-29 LAB — COMPLETE METABOLIC PANEL WITH GFR
AG Ratio: 1.9 (calc) (ref 1.0–2.5)
ALT: 15 U/L (ref 6–29)
AST: 17 U/L (ref 10–35)
Albumin: 4.4 g/dL (ref 3.6–5.1)
Alkaline phosphatase (APISO): 58 U/L (ref 37–153)
BUN: 11 mg/dL (ref 7–25)
CO2: 29 mmol/L (ref 20–32)
Calcium: 9.5 mg/dL (ref 8.6–10.4)
Chloride: 103 mmol/L (ref 98–110)
Creat: 0.81 mg/dL (ref 0.50–0.99)
GFR, Est African American: 91 mL/min/{1.73_m2} (ref >=60)
GFR, Est Non African American: 79 mL/min/{1.73_m2} (ref >=60)
Globulin: 2.3 g/dL (calc) (ref 1.9–3.7)
Glucose, Bld: 99 mg/dL (ref 65–99)
Potassium: 4.4 mmol/L (ref 3.5–5.3)
Sodium: 138 mmol/L (ref 135–146)
Total Bilirubin: 0.8 mg/dL (ref 0.2–1.2)
Total Protein: 6.7 g/dL (ref 6.1–8.1)

## 2020-06-29 LAB — CBC WITH DIFFERENTIAL/PLATELET
Absolute Monocytes: 228 cells/uL (ref 200–950)
Basophils Absolute: 31 cells/uL (ref 0–200)
Basophils Relative: 0.9 %
Eosinophils Absolute: 51 cells/uL (ref 15–500)
Eosinophils Relative: 1.5 %
HCT: 40.2 % (ref 35.0–45.0)
Hemoglobin: 14.1 g/dL (ref 11.7–15.5)
Lymphs Abs: 1122 cells/uL (ref 850–3900)
MCH: 33.4 pg — ABNORMAL HIGH (ref 27.0–33.0)
MCHC: 35.1 g/dL (ref 32.0–36.0)
MCV: 95.3 fL (ref 80.0–100.0)
MPV: 12.3 fL (ref 7.5–12.5)
Monocytes Relative: 6.7 %
Neutro Abs: 1969 cells/uL (ref 1500–7800)
Neutrophils Relative %: 57.9 %
Platelets: 158 10*3/uL (ref 140–400)
RBC: 4.22 10*6/uL (ref 3.80–5.10)
RDW: 11.9 % (ref 11.0–15.0)
Total Lymphocyte: 33 %
WBC: 3.4 10*3/uL — ABNORMAL LOW (ref 3.8–10.8)

## 2020-06-29 LAB — LIPID PANEL
Cholesterol: 218 mg/dL — ABNORMAL HIGH (ref <200)
HDL: 77 mg/dL (ref >=50)
LDL Cholesterol (Calc): 123 mg/dL (calc) — ABNORMAL HIGH
Non-HDL Cholesterol (Calc): 141 mg/dL (calc) — ABNORMAL HIGH (ref <130)
Total CHOL/HDL Ratio: 2.8 (calc) (ref <5.0)
Triglycerides: 82 mg/dL (ref <150)

## 2020-06-29 LAB — TSH: TSH: 1.12 mIU/L (ref 0.40–4.50)

## 2020-07-03 ENCOUNTER — Telehealth: Payer: Self-pay

## 2020-07-03 ENCOUNTER — Other Ambulatory Visit: Payer: Self-pay

## 2020-07-03 DIAGNOSIS — R079 Chest pain, unspecified: Secondary | ICD-10-CM

## 2020-07-03 DIAGNOSIS — R55 Syncope and collapse: Secondary | ICD-10-CM

## 2020-07-03 NOTE — Telephone Encounter (Signed)
I placed a new ordered

## 2020-07-03 NOTE — Telephone Encounter (Signed)
Is there a number to call back or they need a new order?

## 2020-07-03 NOTE — Telephone Encounter (Signed)
She said to extend the deadline on the order. I am not sure if that's a new order or what that requires. I am sorry

## 2020-07-03 NOTE — Telephone Encounter (Signed)
[facility] is requesting you extend the date of the treadmill test. She is scheduled for Nov 7.

## 2020-07-03 NOTE — Telephone Encounter (Signed)
See below

## 2020-07-03 NOTE — Telephone Encounter (Signed)
Correction: Treadmill test is December 7th

## 2020-07-05 DIAGNOSIS — N952 Postmenopausal atrophic vaginitis: Secondary | ICD-10-CM | POA: Diagnosis not present

## 2020-07-05 DIAGNOSIS — Z130 Encounter for screening for diseases of the blood and blood-forming organs and certain disorders involving the immune mechanism: Secondary | ICD-10-CM | POA: Diagnosis not present

## 2020-07-05 DIAGNOSIS — Z780 Asymptomatic menopausal state: Secondary | ICD-10-CM | POA: Diagnosis not present

## 2020-07-05 DIAGNOSIS — Z01419 Encounter for gynecological examination (general) (routine) without abnormal findings: Secondary | ICD-10-CM | POA: Diagnosis not present

## 2020-07-06 ENCOUNTER — Other Ambulatory Visit: Payer: Self-pay | Admitting: Family Medicine

## 2020-07-06 DIAGNOSIS — R928 Other abnormal and inconclusive findings on diagnostic imaging of breast: Secondary | ICD-10-CM

## 2020-08-03 ENCOUNTER — Other Ambulatory Visit (HOSPITAL_COMMUNITY)
Admission: RE | Admit: 2020-08-03 | Discharge: 2020-08-03 | Disposition: A | Discharge status: Home / Self Care | Payer: BC Managed Care – PPO | Source: Ambulatory Visit | Attending: Family Medicine | Admitting: Family Medicine

## 2020-08-03 DIAGNOSIS — Z01812 Encounter for preprocedural laboratory examination: Secondary | ICD-10-CM | POA: Insufficient documentation

## 2020-08-03 DIAGNOSIS — Z20822 Contact with and (suspected) exposure to covid-19: Secondary | ICD-10-CM | POA: Insufficient documentation

## 2020-08-03 LAB — SARS CORONAVIRUS 2 (TAT 6-24 HRS): SARS Coronavirus 2: NEGATIVE

## 2020-08-07 ENCOUNTER — Ambulatory Visit (INDEPENDENT_AMBULATORY_CARE_PROVIDER_SITE_OTHER): Payer: BC Managed Care – PPO

## 2020-08-07 ENCOUNTER — Other Ambulatory Visit: Payer: Self-pay

## 2020-08-07 DIAGNOSIS — R079 Chest pain, unspecified: Secondary | ICD-10-CM

## 2020-08-07 DIAGNOSIS — R55 Syncope and collapse: Secondary | ICD-10-CM

## 2020-08-07 LAB — EXERCISE TOLERANCE TEST
Estimated workload: 13.4 METS
Exercise duration (min): 10 min
Exercise duration (sec): 13 s
MPHR: 160 {beats}/min
Peak HR: 150 {beats}/min
Percent HR: 93 %
RPE: 17
Rest HR: 58 {beats}/min

## 2020-08-14 ENCOUNTER — Ambulatory Visit: Payer: BC Managed Care – PPO

## 2020-08-14 ENCOUNTER — Other Ambulatory Visit: Payer: Self-pay

## 2020-08-14 ENCOUNTER — Ambulatory Visit
Admission: RE | Admit: 2020-08-14 | Discharge: 2020-08-14 | Disposition: A | Discharge status: Home / Self Care | Payer: BC Managed Care – PPO | Source: Ambulatory Visit | Attending: Family Medicine | Admitting: Family Medicine

## 2020-08-14 DIAGNOSIS — N6489 Other specified disorders of breast: Secondary | ICD-10-CM | POA: Diagnosis not present

## 2020-08-14 DIAGNOSIS — R928 Other abnormal and inconclusive findings on diagnostic imaging of breast: Secondary | ICD-10-CM

## 2021-01-29 ENCOUNTER — Ambulatory Visit: Payer: BC Managed Care – PPO | Admitting: Family Medicine

## 2021-03-05 ENCOUNTER — Other Ambulatory Visit: Payer: Self-pay

## 2021-03-05 ENCOUNTER — Ambulatory Visit (INDEPENDENT_AMBULATORY_CARE_PROVIDER_SITE_OTHER): Payer: BC Managed Care – PPO | Admitting: *Deleted

## 2021-03-05 DIAGNOSIS — Z23 Encounter for immunization: Secondary | ICD-10-CM

## 2021-03-05 NOTE — Progress Notes (Signed)
Per orders of Dr. Parker, injection of Tdap 0.5 ml given IM by Oneita Allmon, LPN in left deltoid. Patient tolerated injection well.   

## 2021-03-26 DIAGNOSIS — Z20822 Contact with and (suspected) exposure to covid-19: Secondary | ICD-10-CM | POA: Diagnosis not present

## 2021-05-28 ENCOUNTER — Other Ambulatory Visit: Payer: Self-pay | Admitting: Family Medicine

## 2021-05-28 DIAGNOSIS — Z1231 Encounter for screening mammogram for malignant neoplasm of breast: Secondary | ICD-10-CM

## 2021-08-20 ENCOUNTER — Ambulatory Visit
Admission: RE | Admit: 2021-08-20 | Discharge: 2021-08-20 | Disposition: A | Discharge status: Home / Self Care | Payer: BC Managed Care – PPO | Source: Ambulatory Visit | Attending: Family Medicine | Admitting: Family Medicine

## 2021-08-20 DIAGNOSIS — Z1231 Encounter for screening mammogram for malignant neoplasm of breast: Secondary | ICD-10-CM

## 2021-08-21 ENCOUNTER — Other Ambulatory Visit: Payer: Self-pay | Admitting: Family Medicine

## 2021-08-21 DIAGNOSIS — R928 Other abnormal and inconclusive findings on diagnostic imaging of breast: Secondary | ICD-10-CM

## 2021-09-17 ENCOUNTER — Ambulatory Visit
Admission: RE | Admit: 2021-09-17 | Discharge: 2021-09-17 | Disposition: A | Discharge status: Home / Self Care | Payer: BC Managed Care – PPO | Source: Ambulatory Visit | Attending: Family Medicine | Admitting: Family Medicine

## 2021-09-17 DIAGNOSIS — R922 Inconclusive mammogram: Secondary | ICD-10-CM | POA: Diagnosis not present

## 2021-09-17 DIAGNOSIS — R928 Other abnormal and inconclusive findings on diagnostic imaging of breast: Secondary | ICD-10-CM

## 2021-09-26 DIAGNOSIS — Z1151 Encounter for screening for human papillomavirus (HPV): Secondary | ICD-10-CM | POA: Diagnosis not present

## 2021-09-26 DIAGNOSIS — Z130 Encounter for screening for diseases of the blood and blood-forming organs and certain disorders involving the immune mechanism: Secondary | ICD-10-CM | POA: Diagnosis not present

## 2021-09-26 DIAGNOSIS — Z01419 Encounter for gynecological examination (general) (routine) without abnormal findings: Secondary | ICD-10-CM | POA: Diagnosis not present

## 2021-09-26 DIAGNOSIS — Z124 Encounter for screening for malignant neoplasm of cervix: Secondary | ICD-10-CM | POA: Diagnosis not present

## 2021-10-01 ENCOUNTER — Other Ambulatory Visit: Payer: BC Managed Care – PPO

## 2022-01-02 ENCOUNTER — Encounter: Payer: Self-pay | Admitting: Family Medicine

## 2022-01-02 ENCOUNTER — Ambulatory Visit: Payer: BC Managed Care – PPO | Admitting: Family Medicine

## 2022-01-02 ENCOUNTER — Ambulatory Visit (INDEPENDENT_AMBULATORY_CARE_PROVIDER_SITE_OTHER)
Admission: RE | Admit: 2022-01-02 | Discharge: 2022-01-02 | Disposition: A | Discharge status: Home / Self Care | Payer: BC Managed Care – PPO | Source: Ambulatory Visit | Attending: Family Medicine | Admitting: Family Medicine

## 2022-01-02 VITALS — BP 96/80 | HR 72 | Temp 98.1°F | Ht 65.5 in | Wt 128.0 lb

## 2022-01-02 DIAGNOSIS — R052 Subacute cough: Secondary | ICD-10-CM | POA: Diagnosis not present

## 2022-01-02 DIAGNOSIS — Z87891 Personal history of nicotine dependence: Secondary | ICD-10-CM | POA: Diagnosis not present

## 2022-01-02 DIAGNOSIS — R0789 Other chest pain: Secondary | ICD-10-CM | POA: Diagnosis not present

## 2022-01-02 DIAGNOSIS — R059 Cough, unspecified: Secondary | ICD-10-CM | POA: Diagnosis not present

## 2022-01-02 NOTE — Patient Instructions (Signed)
Please follow up as scheduled for your next visit with me: 04/24/2022  ? ?If you have any questions or concerns, please don't hesitate to send me a message via MyChart or call the office at [phone]. Thank you for visiting with [place] today! It's our pleasure caring for you.  ? ?Start zyrtec nightly. ?Consider omeprazole otc daily if needed.  ? ?Please go to our [facility] office to get your xrays done. You can walk in M-F between 8:30am- noon or 1pm - 5pm. Tell them you are there for xrays ordered by me. They will send me the results, then I will let you know the results with instructions.  ? ?Address: [address].  The Xray department is located in the basement.  ? ?

## 2022-01-02 NOTE — Progress Notes (Signed)
? ?Subjective  ?CC:  ?Chief Complaint  ?Patient presents with  ? Cough  ?  Pt stated that she has been coughing for the past 5weeks and has not gotten better and no OTC  ? ? ?HPI: Ann Henderson is a 61 y.o. female who presents to the office today to address the problems listed above in the chief complaint. ?61 yo with dry hacking cough x 4-5 weeks. Started as URI. + allergy sxs: rhinorrhea, PND, sneezing. No f/c/s or pleuritic cp. No GERD. Has increased etoh recently. No exertional sxs. Active. Remote former smoker.  ?Assessment  ?1. Subacute cough   ?2. Former smoker   ? ?  ?Plan  ?Subacute cough:  likely post inflammatory +/- SAR and/or GERD. Start allergy meds. Pt declines steroids. Add PPI if not improving after decreasing alcohol use. Check cxr.  ? ?Follow up: cpe  ?04/24/2022 ? ?Orders Placed This Encounter  ?Procedures  ? DG Chest 2 View  ? ?No orders of the defined types were placed in this encounter. ? ?  ? ?I reviewed the patients updated PMH, FH, and SocHx.  ?  ?Patient Active Problem List  ? Diagnosis Date Noted  ? Atypical chest pain 2022 01/02/2022  ? Hyperplastic colon polyp 12/04/2017  ? DJD (degenerative joint disease) of cervical spine 12/04/2017  ? Seasonal allergic rhinitis 12/04/2017  ? Vegetarian diet and gluten free 12/04/2017  ? Umbilical hernia 12/04/2017  ? H/o Lyme disease 12/04/2017  ? Hydrocephalus (HCC) 03/02/2012  ? ?Current Meds  ?Medication Sig  ? FISH OIL-KRILL OIL PO Take by mouth.  ? ibuprofen (ADVIL,MOTRIN) 200 MG tablet Take 200 mg by mouth every 6 (six) hours as needed.  ? Multiple Vitamin (MULTIVITAMIN) tablet Take by mouth.  ? ? ?Allergies: ?Patient has No Known Allergies. ?Family History: ?Patient family history includes Early death in her mother; Healthy in her daughter, daughter, father, and son; Heart disease in her maternal grandfather, paternal grandfather, and paternal grandmother; Lung cancer in her mother; Mental illness in her sister; Pulmonary embolism in her  mother. ?Social History:  ?Patient  reports that she quit smoking about 31 years ago. She has never used smokeless tobacco. She reports current alcohol use. She reports that she does not use drugs. ? ?Review of Systems: ?Constitutional: Negative for fever malaise or anorexia ?Cardiovascular: negative for chest pain ?Respiratory: negative for SOB or persistent cough ?Gastrointestinal: negative for abdominal pain ? ?Objective  ?Vitals: BP 96/80   Pulse 72   Temp 98.1 ?F (36.7 ?C)   Ht 5' 5.5" (1.664 m)   Wt 128 lb (58.1 kg)   LMP 04/04/2011   SpO2 99%   BMI 20.98 kg/m?  ?General: no acute distress , A&Ox3 ?HEENT: PEERL, conjunctiva normal, neck is supple ?Cardiovascular:  RRR without murmur or gallop.  ?Respiratory:  Good breath sounds bilaterally, CTAB with normal respiratory effort ?Skin:  Warm, no rashes ? ? ? ?Commons side effects, risks, benefits, and alternatives for medications and treatment plan prescribed today were discussed, and the patient expressed understanding of the given instructions. Patient is instructed to call or message via MyChart if he/she has any questions or concerns regarding our treatment plan. No barriers to understanding were identified. We discussed Red Flag symptoms and signs in detail. Patient expressed understanding regarding what to do in case of urgent or emergency type symptoms.  ?Medication list was reconciled, printed and provided to the patient in AVS. Patient instructions and summary information was reviewed with the patient as documented in  the AVS. ?This note was prepared with assistance of Conservation officer, historic buildings. Occasional wrong-word or sound-a-like substitutions may have occurred due to the inherent limitations of voice recognition software ? ?This visit occurred during the SARS-CoV-2 public health emergency.  Safety protocols were in place, including screening questions prior to the visit, additional usage of staff PPE, and extensive cleaning of exam  room while observing appropriate contact time as indicated for disinfecting solutions.  ? ?

## 2022-01-16 DIAGNOSIS — D225 Melanocytic nevi of trunk: Secondary | ICD-10-CM | POA: Diagnosis not present

## 2022-01-16 DIAGNOSIS — D485 Neoplasm of uncertain behavior of skin: Secondary | ICD-10-CM | POA: Diagnosis not present

## 2022-01-16 DIAGNOSIS — D2239 Melanocytic nevi of other parts of face: Secondary | ICD-10-CM | POA: Diagnosis not present

## 2022-01-16 DIAGNOSIS — D1801 Hemangioma of skin and subcutaneous tissue: Secondary | ICD-10-CM | POA: Diagnosis not present

## 2022-01-16 DIAGNOSIS — D692 Other nonthrombocytopenic purpura: Secondary | ICD-10-CM | POA: Diagnosis not present

## 2022-01-16 DIAGNOSIS — D2262 Melanocytic nevi of left upper limb, including shoulder: Secondary | ICD-10-CM | POA: Diagnosis not present

## 2022-02-04 DIAGNOSIS — H52203 Unspecified astigmatism, bilateral: Secondary | ICD-10-CM | POA: Diagnosis not present

## 2022-02-04 DIAGNOSIS — H2513 Age-related nuclear cataract, bilateral: Secondary | ICD-10-CM | POA: Diagnosis not present

## 2022-04-24 ENCOUNTER — Ambulatory Visit (INDEPENDENT_AMBULATORY_CARE_PROVIDER_SITE_OTHER): Payer: BC Managed Care – PPO | Admitting: Family Medicine

## 2022-04-24 ENCOUNTER — Encounter: Payer: Self-pay | Admitting: Family Medicine

## 2022-04-24 VITALS — BP 100/70 | HR 63 | Temp 98.7°F | Ht 65.5 in | Wt 129.4 lb

## 2022-04-24 DIAGNOSIS — Z789 Other specified health status: Secondary | ICD-10-CM

## 2022-04-24 DIAGNOSIS — M8580 Other specified disorders of bone density and structure, unspecified site: Secondary | ICD-10-CM

## 2022-04-24 DIAGNOSIS — Z0000 Encounter for general adult medical examination without abnormal findings: Secondary | ICD-10-CM | POA: Diagnosis not present

## 2022-04-24 DIAGNOSIS — M79604 Pain in right leg: Secondary | ICD-10-CM | POA: Diagnosis not present

## 2022-04-24 DIAGNOSIS — M79605 Pain in left leg: Secondary | ICD-10-CM

## 2022-04-24 DIAGNOSIS — S92524A Nondisplaced fracture of medial phalanx of right lesser toe(s), initial encounter for closed fracture: Secondary | ICD-10-CM

## 2022-04-24 DIAGNOSIS — J449 Chronic obstructive pulmonary disease, unspecified: Secondary | ICD-10-CM

## 2022-04-24 DIAGNOSIS — W5501XA Bitten by cat, initial encounter: Secondary | ICD-10-CM

## 2022-04-24 DIAGNOSIS — Z780 Asymptomatic menopausal state: Secondary | ICD-10-CM | POA: Diagnosis not present

## 2022-04-24 DIAGNOSIS — Q030 Malformations of aqueduct of Sylvius: Secondary | ICD-10-CM

## 2022-04-24 DIAGNOSIS — J301 Allergic rhinitis due to pollen: Secondary | ICD-10-CM

## 2022-04-24 LAB — CBC WITH DIFFERENTIAL/PLATELET
Basophils Absolute: 0.1 10*3/uL (ref 0.0–0.1)
Basophils Relative: 1.1 % (ref 0.0–3.0)
Eosinophils Absolute: 0.1 10*3/uL (ref 0.0–0.7)
Eosinophils Relative: 2.1 % (ref 0.0–5.0)
HCT: 41.7 % (ref 36.0–46.0)
Hemoglobin: 14.1 g/dL (ref 12.0–15.0)
Lymphocytes Relative: 25.9 % (ref 12.0–46.0)
Lymphs Abs: 1.2 10*3/uL (ref 0.7–4.0)
MCHC: 33.8 g/dL (ref 30.0–36.0)
MCV: 95.4 fl (ref 78.0–100.0)
Monocytes Absolute: 0.3 10*3/uL (ref 0.1–1.0)
Monocytes Relative: 5.5 % (ref 3.0–12.0)
Neutro Abs: 3 10*3/uL (ref 1.4–7.7)
Neutrophils Relative %: 65.4 % (ref 43.0–77.0)
Platelets: 153 10*3/uL (ref 150.0–400.0)
RBC: 4.37 Mil/uL (ref 3.87–5.11)
RDW: 13 % (ref 11.5–15.5)
WBC: 4.6 10*3/uL (ref 4.0–10.5)

## 2022-04-24 LAB — COMPREHENSIVE METABOLIC PANEL
ALT: 16 U/L (ref 0–35)
AST: 19 U/L (ref 0–37)
Albumin: 4.5 g/dL (ref 3.5–5.2)
Alkaline Phosphatase: 65 U/L (ref 39–117)
BUN: 8 mg/dL (ref 6–23)
CO2: 29 mEq/L (ref 19–32)
Calcium: 9.8 mg/dL (ref 8.4–10.5)
Chloride: 104 mEq/L (ref 96–112)
Creatinine, Ser: 0.8 mg/dL (ref 0.40–1.20)
GFR: 79.28 mL/min (ref >60.00)
Glucose, Bld: 100 mg/dL — ABNORMAL HIGH (ref 70–99)
Potassium: 5.8 mEq/L — ABNORMAL HIGH (ref 3.5–5.1)
Sodium: 139 mEq/L (ref 135–145)
Total Bilirubin: 0.7 mg/dL (ref 0.2–1.2)
Total Protein: 7 g/dL (ref 6.0–8.3)

## 2022-04-24 LAB — LIPID PANEL
Cholesterol: 198 mg/dL (ref 0–200)
HDL: 70.8 mg/dL (ref >39.00)
LDL Cholesterol: 113 mg/dL — ABNORMAL HIGH (ref 0–99)
NonHDL: 126.99
Total CHOL/HDL Ratio: 3
Triglycerides: 71 mg/dL (ref 0.0–149.0)
VLDL: 14.2 mg/dL (ref 0.0–40.0)

## 2022-04-24 LAB — TSH: TSH: 1.36 u[IU]/mL (ref 0.35–5.50)

## 2022-04-24 LAB — VITAMIN D 25 HYDROXY (VIT D DEFICIENCY, FRACTURES): VITD: 35.55 ng/mL (ref 30.00–100.00)

## 2022-04-24 LAB — VITAMIN B12: Vitamin B-12: 587 pg/mL (ref 211–911)

## 2022-04-24 NOTE — Progress Notes (Signed)
Subjective  Chief Complaint  Patient presents with   Annual Exam    Pt here for Annual exam and is currently fasting. Pt stated that she broke her Rt pinky to play with her dogs     HPI: Ann Henderson is a 61 y.o. female who presents to [facility] [place] today for a Female Wellness Visit. She also has the concerns and/or needs as listed above in the chief complaint. These will be addressed in addition to the Health Maintenance Visit.   Wellness Visit: annual visit with health maintenance review and exam without Pap  HM: sees Dr. Senaida Ores for female wellness. Reviewed notes 09/2021. Cervical cancer screen done and neg w/ neg HR HPV. Colonoscopy due next year (Dr. Ewing Schlein). Mammo due in December. Dexa w/ h/o osteopenia 2018. Due for recheck. Intermittently takes calcium and vit D supplements. Healthy diet, vegetarian. Runner.  Chronic disease f/u and/or acute problem visit: (deemed necessary to be done in addition to the wellness visit): She is a International aid/development worker and experienced a bite from her cat 3 days ago.  Self treating with amoxicillin.  Seems to be doing better.  Has puncture wounds on her right thumb and ring finger.  No fevers She broke her right fifth toe pain last week. We reviewed the xray she took of it? Proximal phalynx non displaced spiral fracture w/o joint involvement. Buddy taping and doing ok,.  H/o lymes and hydrocephalus: no sxs. Complains of bilateral lower extremity pain, mostly in the.  Nonexertional, does not affect her when she is running or walking.  Describes more as cramping aching pain worse in the mornings.  No joint pain.  Mild stiffness.  Intermittent muscle cramps as well.  She does have varicose veins but they are not red hot swollen or tender. Seasonal allergies: She did treat with oral antihistamines and her cough improved.  Mild congestion now.  Not taking anything. COPD by chest x-ray: 25 to 30 pack year smoker, quit age 64.  No chronic  symptoms.  No shortness of breath or wheezing.  Assessment  1. Annual physical exam   2. Vegetarian diet and gluten free   3. Osteopenia, unspecified location   4. Asymptomatic menopausal state   5. Pain in both lower extremities   6. Chronic obstructive pulmonary disease, unspecified COPD type (HCC)   7. Seasonal allergic rhinitis due to pollen   8. Closed nondisplaced fracture of middle phalanx of lesser toe of right foot, initial encounter   9. Cat bite, initial encounter   10. Hydrocephalus with sparing of fourth ventricle due to aqueductal stenosis [facility]) Chronic     Plan  Female Wellness Visit: Age appropriate Health Maintenance and Prevention measures were discussed with patient. Included topics are cancer screening recommendations, ways to keep healthy (see AVS) including dietary and exercise recommendations, regular eye and dental care, use of seat belts, and avoidance of moderate alcohol use and tobacco use.  due for mammogram in December.  Colonoscopy next year BMI: discussed patient's BMI and encouraged positive lifestyle modifications to help get to or maintain a target BMI. HM needs and immunizations were addressed and ordered. See below for orders. See HM and immunization section for updates.  Discussed Shingrix vaccination.  Patient will return when convenient.  Education given Routine labs and screening tests ordered including cmp, cbc and lipids where appropriate. Discussed recommendations regarding Vit D and calcium supplementation (see AVS)  Chronic disease management visit and/or acute problem visit: Osteopenia: Bone density due.  Discussed calcium and vitamin D supplementation. Calf pain: Check vitamin D levels.  Recommend stretching.  Good hydration. COPD, asymptomatic: Education given.  Will monitor. Seasonal allergies: Intermittently symptomatic.  Oral antihistamines as needed. Toe fracture: Buddy tape and hard soled shoe recommended.  She will rex-ray in 4 to 6  weeks at her office. Cat bite: Responsive to amoxicillin.  She will complete a 7 to 10-day course.  Follow-up if increased swelling, redness or pain. Hydrocephalus: Stable Vegetarian diet: Check B12  Follow up: 12 months for complete physical Orders Placed This Encounter  Procedures   DG Bone Density   CBC with Differential/Platelet   Comprehensive metabolic panel   Lipid panel   VITAMIN D 25 Hydroxy (Vit-D Deficiency, Fractures)   TSH   Vitamin B12   No orders of the defined types were placed in this encounter.     Body mass index is 21.21 kg/m. Wt Readings from Last 3 Encounters:  04/24/22 129 lb 6.4 oz (58.7 kg)  01/02/22 128 lb (58.1 kg)  06/28/20 131 lb 3.2 oz (59.5 kg)     Patient Active Problem List   Diagnosis Date Noted   COPD (chronic obstructive pulmonary disease) (HCC) 04/24/2022    Priority: High    Hyperinflated lungs on cxr 2023, former long term smoker. No sxs.     Hydrocephalus with sparing of fourth ventricle due to aqueductal stenosis [facility])     Priority: High    Evaluated by neurology and neurosurgeon    Osteopenia 04/24/2022    Priority: Medium     dexa 2018 lowest T=-1.8, breast center    Hyperplastic colon polyp 12/04/2017    Priority: Medium    DJD (degenerative joint disease) of cervical spine 12/04/2017    Priority: Medium    Umbilical hernia 12/04/2017    Priority: Medium    H/o Lyme disease 12/04/2017    Priority: Medium    Atypical chest pain 2022 01/02/2022    Priority: Low    Nl EKG. Normal ETT    Seasonal allergic rhinitis 12/04/2017    Priority: Low   Vegetarian diet and gluten free 12/04/2017    Priority: Low   Health Maintenance  Topic Date Due   Zoster Vaccines- Shingrix (1 of 2) Never done   DEXA SCAN  12/16/2021   INFLUENZA VACCINE  04/01/2022   COVID-19 Vaccine (3 - Pfizer series) 05/10/2022 (Originally 01/03/2020)   MAMMOGRAM  08/20/2022   COLONOSCOPY (Pts 45-16yrs Insurance coverage will need to be confirmed)   07/14/2023   PAP SMEAR-Modifier  09/25/2026   TETANUS/TDAP  03/06/2031   Hepatitis C Screening  Completed   HIV Screening  Completed   HPV VACCINES  Aged Out   Immunization History  Administered Date(s) Administered   PFIZER(Purple Top)SARS-COV-2 Vaccination 10/14/2019, 11/08/2019   Td 09/02/1999, 12/26/2009   Tdap 03/05/2021   We updated and reviewed the patient's past history in detail and it is documented below. Allergies: Patient has No Known Allergies. Past Medical History Patient  has a past medical history of H/o Lyme disease (12/04/2017), Hydrocephalus with sparing of fourth ventricle due to aqueductal stenosis (HCC) (12/04/2017), and Vegetarian diet and gluten free (12/04/2017). Past Surgical History Patient  has a past surgical history that includes Microdiscectomy lumbar; dermoid tumor; Oophorectomy; and Cesarean section. Family History: Patient family history includes Early death in her mother; Healthy in her daughter, daughter, father, and son; Heart disease in her maternal grandfather, paternal grandfather, and paternal grandmother; Lung cancer in her mother; Mental illness  in her sister; Pulmonary embolism in her mother. Social History:  Patient  reports that she quit smoking about 32 years ago. She has never used smokeless tobacco. She reports current alcohol use. She reports that she does not use drugs.  Review of Systems: Constitutional: negative for fever or malaise Ophthalmic: negative for photophobia, double vision or loss of vision Cardiovascular: negative for chest pain, dyspnea on exertion, or new LE swelling Respiratory: negative for SOB or persistent cough Gastrointestinal: negative for abdominal pain, change in bowel habits or melena Genitourinary: negative for dysuria or gross hematuria, no abnormal uterine bleeding or disharge Musculoskeletal: negative for new gait disturbance or muscular weakness Integumentary: negative for new or persistent rashes, no breast  lumps Neurological: negative for TIA or stroke symptoms Psychiatric: negative for SI or delusions Allergic/Immunologic: negative for hives  Patient Care Team    Relationship Specialty Notifications Start End  Willow Ora, MD PCP - General Family Medicine  12/04/17   Huel Cote, MD Consulting Physician Obstetrics and Gynecology  12/04/17   Vida Rigger, MD Consulting Physician Gastroenterology  12/04/17   Donzetta Starch, MD Consulting Physician Dermatology  03/29/19     Objective  Vitals: BP 100/70   Pulse 63   Temp 98.7 F (37.1 C)   Ht 5' 5.5" (1.664 m)   Wt 129 lb 6.4 oz (58.7 kg)   LMP 04/04/2011   SpO2 99%   BMI 21.21 kg/m  General:  Well developed, well nourished, no acute distress  Psych:  Alert and orientedx3,normal mood and affect HEENT:  Normocephalic, atraumatic, non-icteric sclera,  supple neck without adenopathy, mass or thyromegaly Cardiovascular:  Normal S1, S2, RRR without gallop, rub or murmur Respiratory:  Good breath sounds bilaterally, CTAB with normal respiratory effort Gastrointestinal: normal bowel sounds, soft, non-tender, no noted masses. No HSM MSK: no deformities, contusions. Joints are without erythema or swelling.  Right fifth toe without ecchymosis, mild swelling, no deformity.  Right thumb and ring finger with puncture wounds and mild redness, no pus Skin:  Warm, no rashes or suspicious lesions noted Neurologic:    Mental status is normal. Gross motor and sensory exams are normal. Normal gait. No tremor   Commons side effects, risks, benefits, and alternatives for medications and treatment plan prescribed today were discussed, and the patient expressed understanding of the given instructions. Patient is instructed to call or message via MyChart if he/she has any questions or concerns regarding our treatment plan. No barriers to understanding were identified. We discussed Red Flag symptoms and signs in detail. Patient expressed understanding regarding  what to do in case of urgent or emergency type symptoms.  Medication list was reconciled, printed and provided to the patient in AVS. Patient instructions and summary information was reviewed with the patient as documented in the AVS. This note was prepared with assistance of Dragon voice recognition software. Occasional wrong-word or sound-a-like substitutions may have occurred due to the inherent limitations of voice recognition software  This visit occurred during the SARS-CoV-2 public health emergency.  Safety protocols were in place, including screening questions prior to the visit, additional usage of staff PPE, and extensive cleaning of exam room while observing appropriate contact time as indicated for disinfecting solutions.

## 2022-04-24 NOTE — Patient Instructions (Addendum)
Please return in 12 months for your annual complete physical; please come fasting.   I will release your lab results to you on your MyChart account with further instructions. You may see the results before I do, but when I review them I will send you a message with my report or have my assistant call you if things need to be discussed. Please reply to my message with any questions. Thank you!   If you have any questions or concerns, please don't hesitate to send me a message via MyChart or call the office at [phone]. Thank you for visiting with [place] today! It's our pleasure caring for you.   I have ordered a mammogram and/or bone density for you as we discussed today: you may schedule in December or January. [x]   Mammogram  [x]   Bone Density  Please call the office checked below to schedule your appointment:  [x]   [facility]      [address] [place],        [address] Wing         []   [facility]  [address] [place],  [facility]  Calcium Intake Recommendations You can take Caltrate Plus twice a day or get it through your diet or other OTC supplements (Viactiv, OsCal etc)  Calcium is a mineral that affects many functions in the body, including: Blood clotting. Blood vessel function. Nerve impulse conduction. Hormone secretion. Muscle contraction. Bone and teeth functions.  Most of your body's calcium supply is stored in your bones and teeth. When your calcium stores are low, you may be at risk for low bone mass, bone loss, and bone fractures. Consuming enough calcium helps to grow healthy bones and teeth and to prevent breakdown over time. It is very important that you get enough calcium if you are: A child undergoing rapid growth. An adolescent girl. A pre- or post-menopausal woman. A woman whose menstrual cycle has stopped due to anorexia nervosa or regular intense exercise. An individual with lactose  intolerance or a milk allergy. A vegetarian.  What is my plan? Try to consume the recommended amount of calcium daily based on your age. Depending on your overall health, your health care provider may recommend increased calcium intake. General daily calcium intake recommendations by age are: Birth to 6 months: 200 mg. Infants 7 to 12 months: 260 mg. Children 1 to 3 years: 700 mg. Children 4 to 8 years: 1,000 mg. Children 9 to 13 years: 1,300 mg. Teens 14 to 18 years: 1,300 mg. Adults 19 to 50 years: 1,000 mg. Adult women 51 to 70 years: 1,200 mg. Adult men 51 to 70 years: 1,000 mg. Adults 71 years and older: 1,200 mg. Pregnant and breastfeeding teens: 1,300 mg. Pregnant and breastfeeding adults: 1,000 mg.  What do I need to know about calcium intake? In order for the body to absorb calcium, it needs vitamin D. You can get vitamin D through (we recommend getting [phone] units of Vitamin D daily) Direct exposure of the skin to sunlight. Foods, such as egg yolks, liver, saltwater fish, and fortified milk. Supplements. Consuming too much calcium may cause: Constipation. Decreased absorption of iron and zinc. Kidney stones. Calcium supplements may interact with certain medicines. Check with your health care provider before starting any calcium supplements. Try to get most of your calcium from food. What foods can I eat? Grains  Fortified oatmeal. Fortified ready-to-eat cereals.  Fortified frozen waffles. Vegetables Turnip greens. Broccoli. Fruits Fortified orange juice. Meats and Other Protein Sources Canned sardines with bones. Canned salmon with bones. Soy beans. Tofu. Baked beans. Almonds. [place] nuts. Sunflower seeds. Dairy Milk. Yogurt. Cheese. Cottage cheese. Beverages Fortified soy milk. Fortified rice milk. Sweets/Desserts Pudding. Ice Cream. Milkshakes. Blackstrap molasses. The items listed above may not be a complete list of recommended foods or beverages. Contact  your dietitian for more options. What foods can affect my calcium intake? It may be more difficult for your body to use calcium or calcium may leave your body more quickly if you consume large amounts of: Sodium. Protein. Caffeine. Alcohol.  This information is not intended to replace advice given to you by your health care provider. Make sure you discuss any questions you have with your health care provider. Document Released: 04/01/2004 Document Revised: 03/07/2016 Document Reviewed: 01/24/2014 Elsevier Interactive Patient Education  2018 [employer].

## 2022-04-28 ENCOUNTER — Other Ambulatory Visit: Payer: Self-pay

## 2022-04-28 DIAGNOSIS — E875 Hyperkalemia: Secondary | ICD-10-CM

## 2022-04-28 NOTE — Progress Notes (Signed)
Please call patient: I have reviewed his/her lab results. Potassium is elevated: likely a lab error but I recommend repeating at her convenience with lab visit and bmp . All other labs are stable.    Please send her MRN to jeanette reporting a high potassium (she is keeping records of these). thanks

## 2022-05-02 ENCOUNTER — Other Ambulatory Visit: Payer: Self-pay | Admitting: Family Medicine

## 2022-05-02 DIAGNOSIS — Z780 Asymptomatic menopausal state: Secondary | ICD-10-CM

## 2022-05-02 DIAGNOSIS — M8580 Other specified disorders of bone density and structure, unspecified site: Secondary | ICD-10-CM

## 2022-05-26 ENCOUNTER — Encounter: Payer: Self-pay | Admitting: *Deleted

## 2022-07-31 IMAGING — US US BREAST*L* LIMITED INC AXILLA
1 series · 3 of 3 positions shown · non-contrast
Comparison: Previous exam(s).

CLINICAL DATA: 60-year-old female presenting as a recall from
screening for possible left breast asymmetry.

EXAM:
DIGITAL DIAGNOSTIC UNILATERAL LEFT MAMMOGRAM WITH TOMOSYNTHESIS AND
CAD; ULTRASOUND LEFT BREAST LIMITED
TECHNIQUE: Left digital diagnostic mammography and breast tomosynthesis was
performed. The images were evaluated with computer-aided detection.;
Targeted ultrasound examination of the left breast was performed.

[Series 1: us breast*left* limited inc axilla · 0.05mm/px · 3 of 3 slices shown]
[im 1/3]
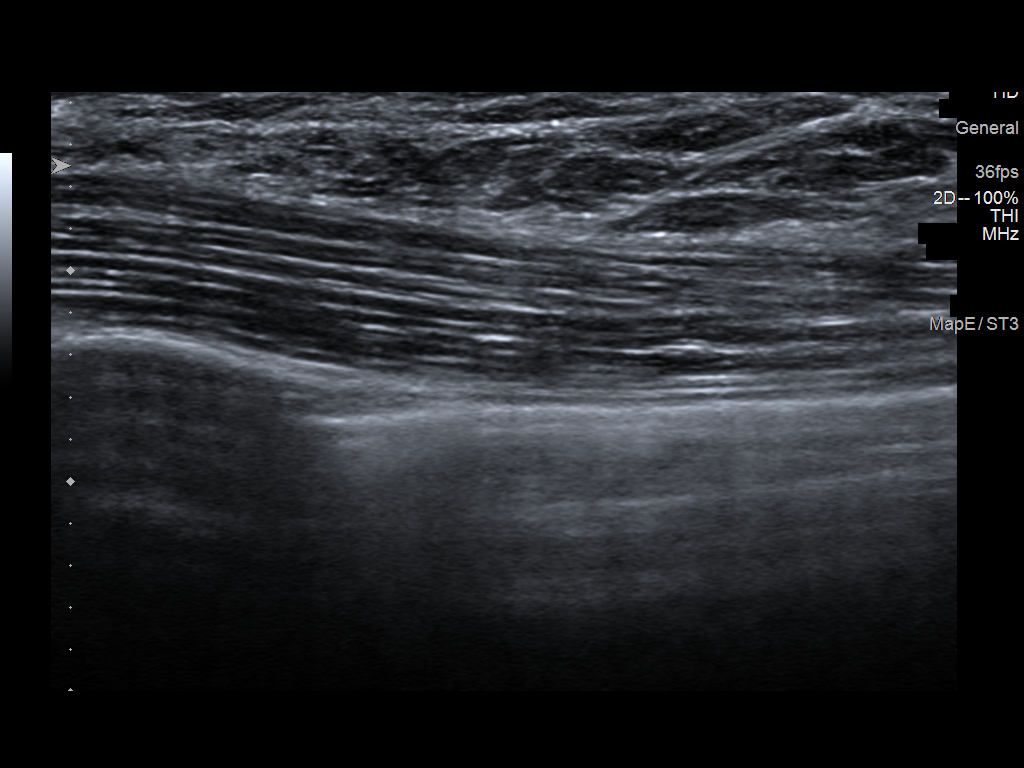
[im 2/3]
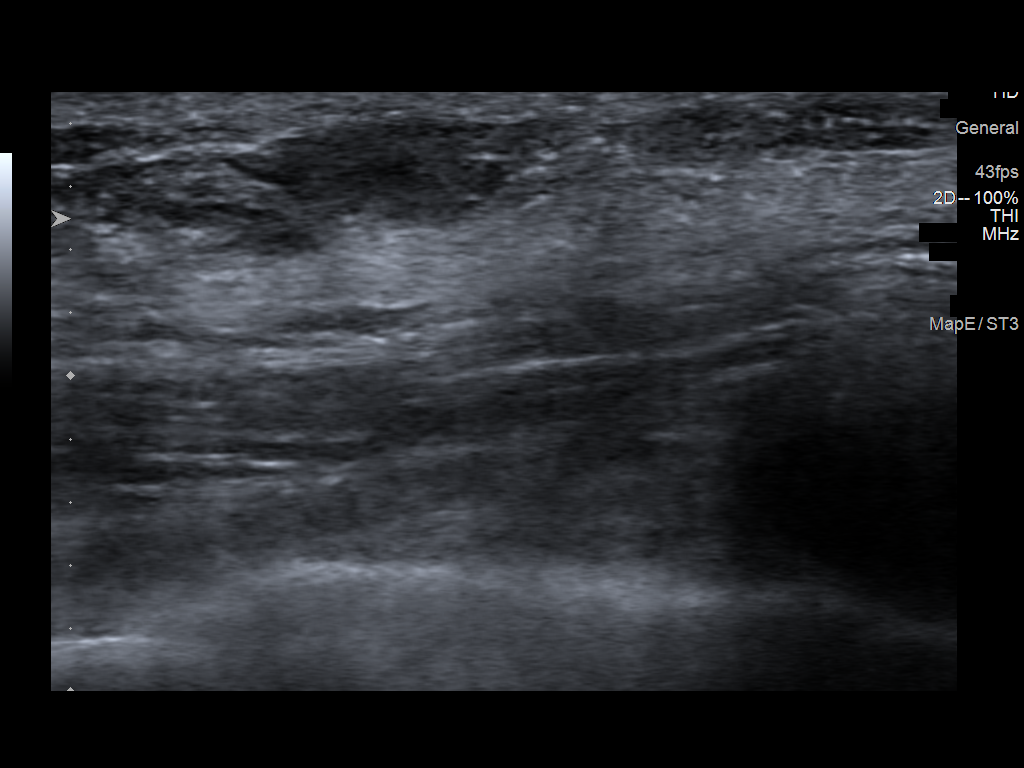
[im 3/3]
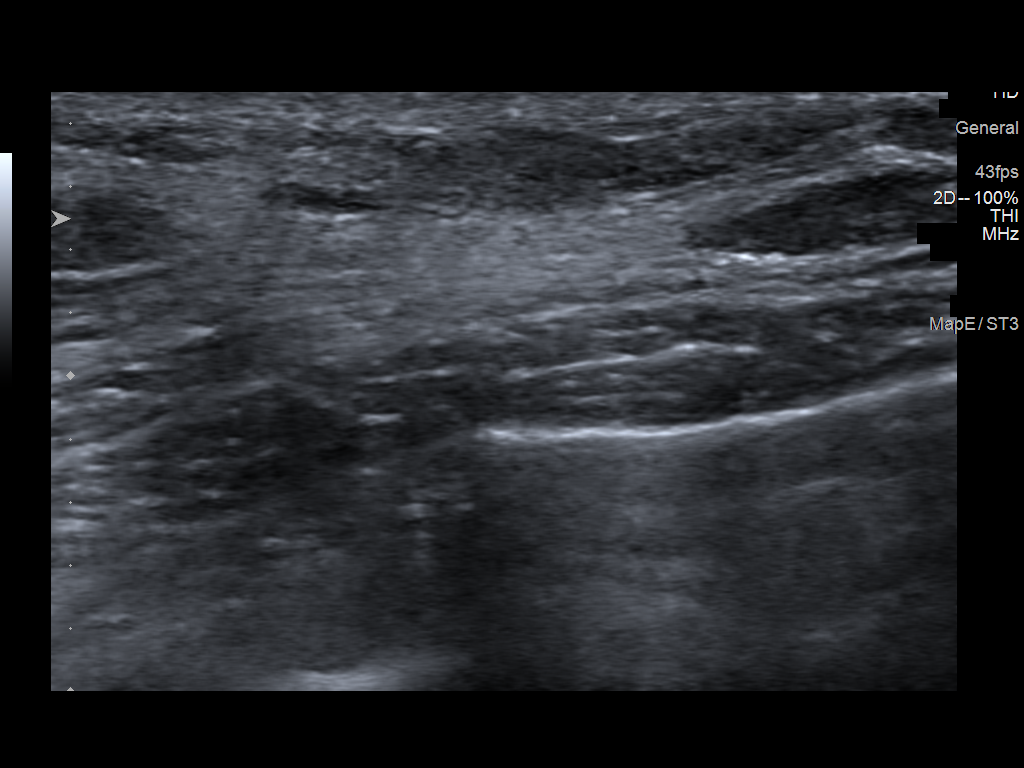

[3 of 3 positions shown; findings below may reference images not displayed]

ACR Breast Density Category c: The breast tissue is heterogeneously
dense, which may obscure small masses.
FINDINGS: Mammogram:

Spot compression tomosynthesis cc and full field mL tomosynthesis
views of the left breast were performed for a questioned asymmetry
seen only on CC view in the outer left breast. On the additional
imaging the tissue in this area disperses without a definite
persistent asymmetry, mass, or distortion.

On physical exam of the outer left breast I do not feel a discrete
fixed mass or focal area of thickening.

Ultrasound:

Targeted ultrasound performed throughout the outer aspect of the
left breast demonstrating no cystic or solid mass to correspond to
the finding question on screening mammogram.
IMPRESSION: No mammographic or sonographic evidence of malignancy in the outer
left breast.

RECOMMENDATION:
Screening mammogram in one year.(Code:ID-A-2M4)

I have discussed the findings and recommendations with the patient.
If applicable, a reminder letter will be sent to the patient
regarding the next appointment.

BI-RADS CATEGORY  1: Negative.

## 2022-07-31 IMAGING — MG MM DIGITAL DIAGNOSTIC UNILAT*L* W/ TOMO W/ CAD
4 series · 4 of 12 positions shown · non-contrast
Comparison: Previous exam(s).

CLINICAL DATA: 60-year-old female presenting as a recall from
screening for possible left breast asymmetry.

EXAM:
DIGITAL DIAGNOSTIC UNILATERAL LEFT MAMMOGRAM WITH TOMOSYNTHESIS AND
CAD; ULTRASOUND LEFT BREAST LIMITED
TECHNIQUE: Left digital diagnostic mammography and breast tomosynthesis was
performed. The images were evaluated with computer-aided detection.;
Targeted ultrasound examination of the left breast was performed.

[L CC synth-2D]
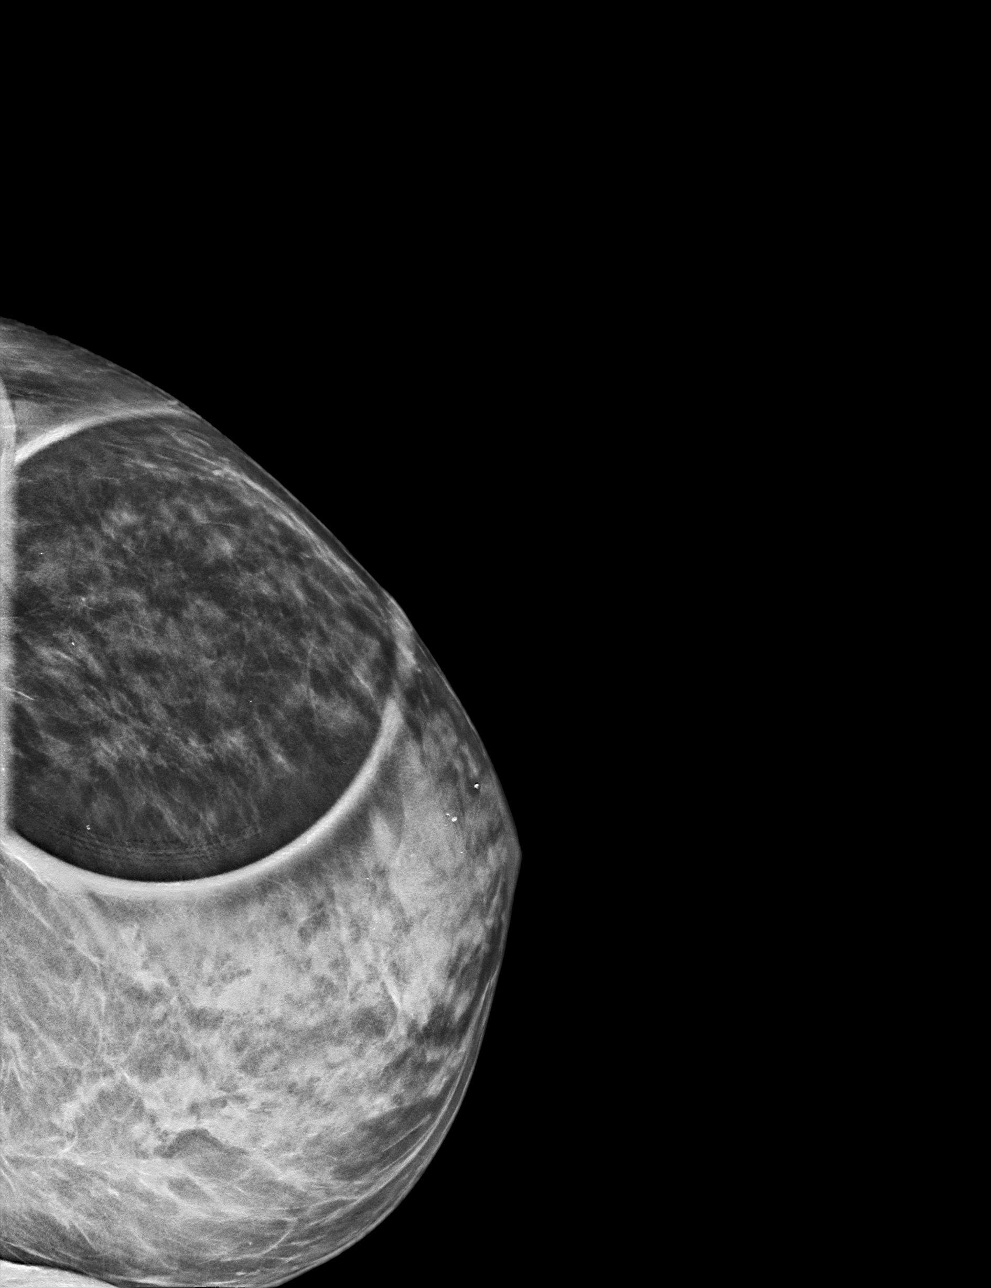

[L ML synth-2D]
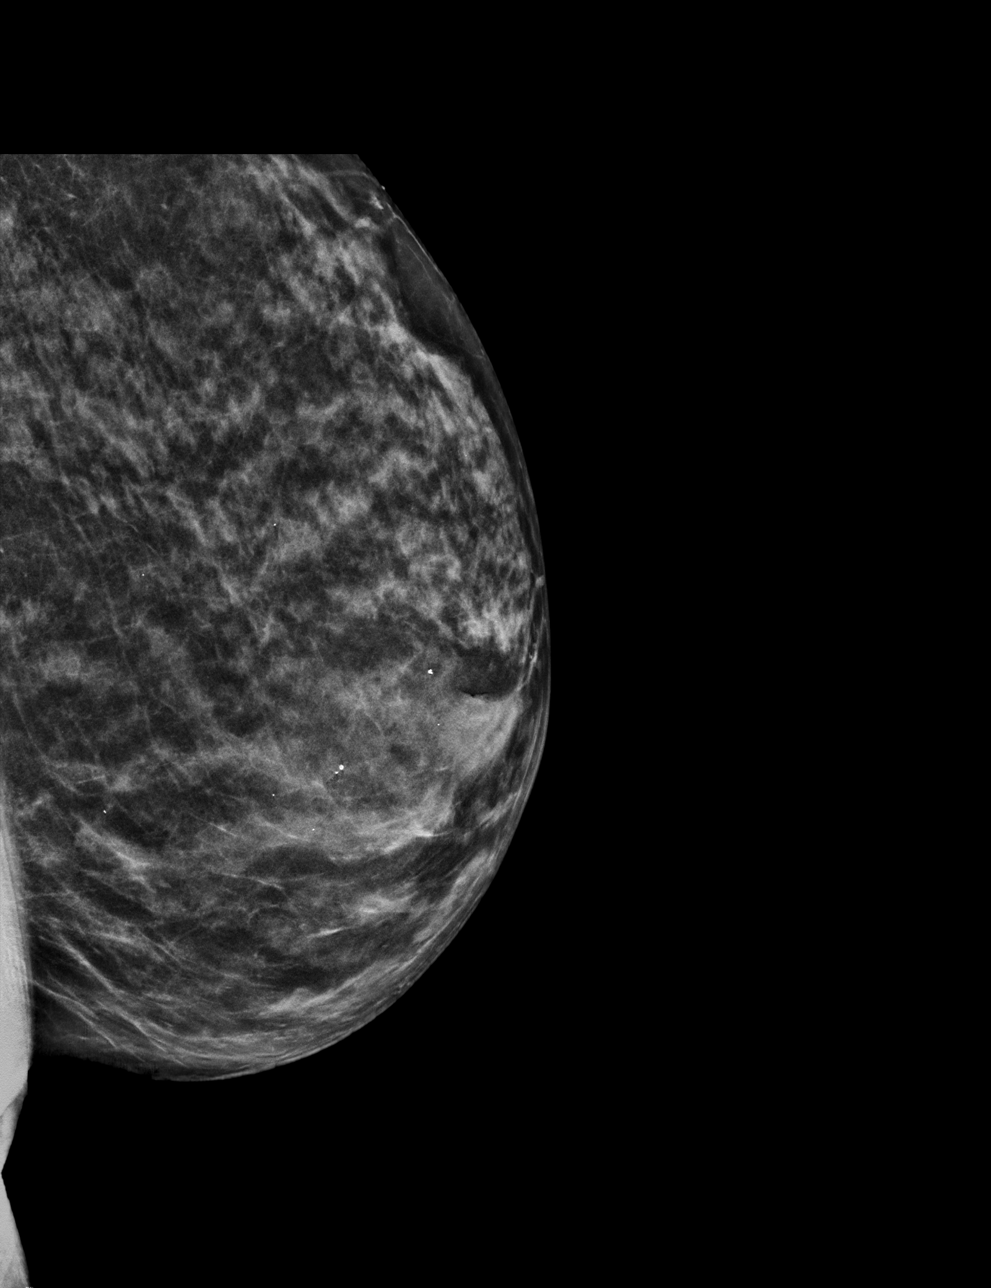

[L ML tomo · tomo slice 24/47.0]
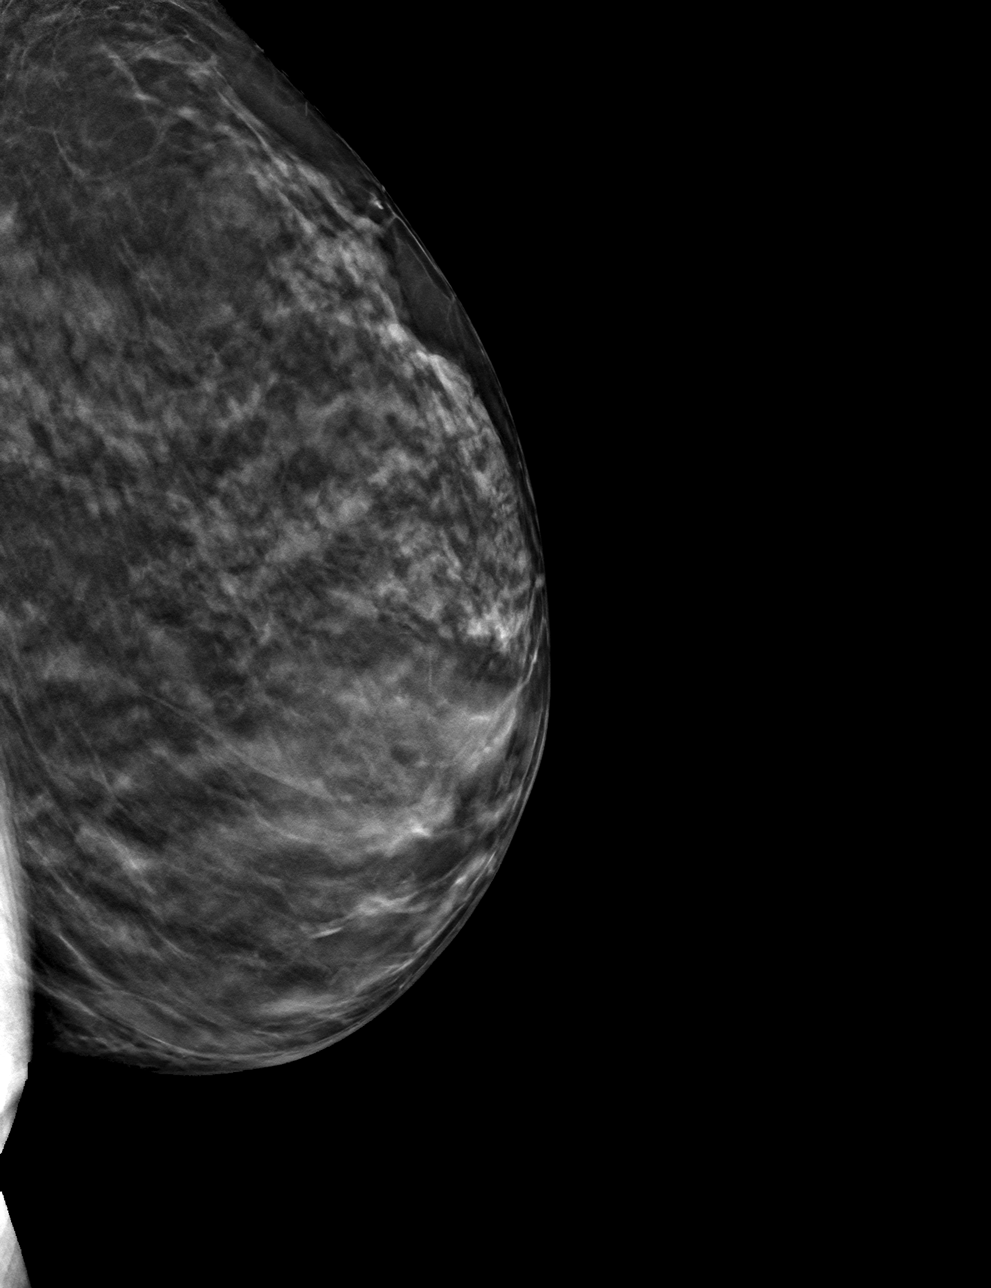

[L CC tomo · tomo slice 24/47.0]
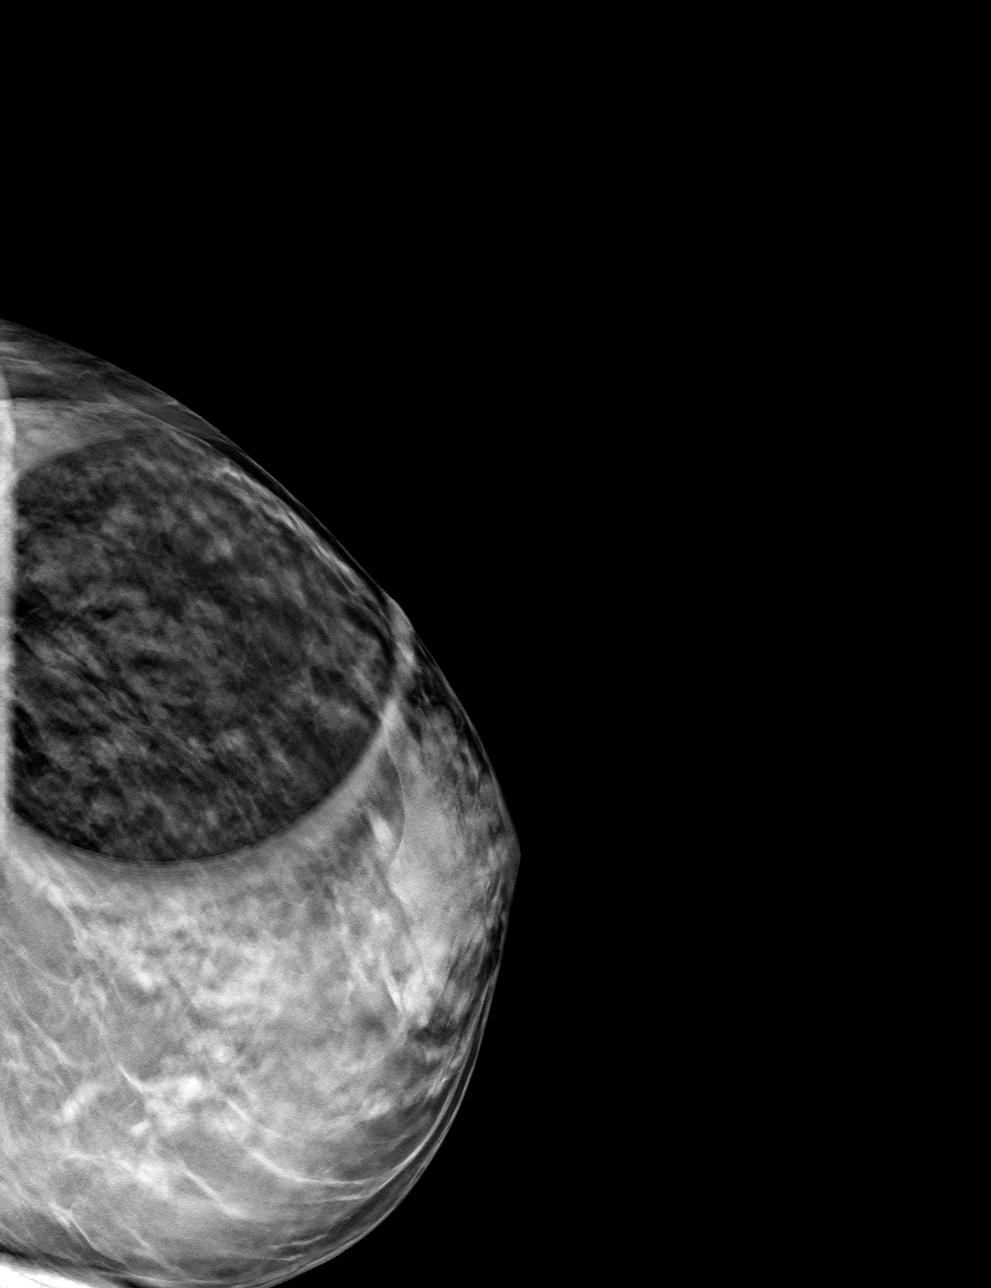

[4 of 12 positions shown; findings below may reference images not displayed]

ACR Breast Density Category c: The breast tissue is heterogeneously
dense, which may obscure small masses.
FINDINGS: Mammogram:

Spot compression tomosynthesis cc and full field mL tomosynthesis
views of the left breast were performed for a questioned asymmetry
seen only on CC view in the outer left breast. On the additional
imaging the tissue in this area disperses without a definite
persistent asymmetry, mass, or distortion.

On physical exam of the outer left breast I do not feel a discrete
fixed mass or focal area of thickening.

Ultrasound:

Targeted ultrasound performed throughout the outer aspect of the
left breast demonstrating no cystic or solid mass to correspond to
the finding question on screening mammogram.
IMPRESSION: No mammographic or sonographic evidence of malignancy in the outer
left breast.

RECOMMENDATION:
Screening mammogram in one year.(Code:ID-A-2M4)

I have discussed the findings and recommendations with the patient.
If applicable, a reminder letter will be sent to the patient
regarding the next appointment.

BI-RADS CATEGORY  1: Negative.

## 2022-08-05 ENCOUNTER — Ambulatory Visit (INDEPENDENT_AMBULATORY_CARE_PROVIDER_SITE_OTHER): Payer: BC Managed Care – PPO

## 2022-08-05 ENCOUNTER — Ambulatory Visit: Payer: BC Managed Care – PPO | Admitting: Podiatry

## 2022-08-05 ENCOUNTER — Encounter: Payer: Self-pay | Admitting: Podiatry

## 2022-08-05 DIAGNOSIS — G5791 Unspecified mononeuropathy of right lower limb: Secondary | ICD-10-CM | POA: Diagnosis not present

## 2022-08-05 DIAGNOSIS — M7750 Other enthesopathy of unspecified foot: Secondary | ICD-10-CM

## 2022-08-05 DIAGNOSIS — M778 Other enthesopathies, not elsewhere classified: Secondary | ICD-10-CM | POA: Diagnosis not present

## 2022-08-05 NOTE — Progress Notes (Signed)
Subjective:  Patient ID: Ann Henderson, female    DOB: Sep 09, 1960,  MRN: [id number] HPI Chief Complaint  Patient presents with   Toe Pain    5th toe right - states she has neuropathy in 5th toe/MPJ area x couple years, symptoms has increased along some the lateral side, also broke toe a few weeks ago-she is a Administrator, Civil Service (xrayed herself), toe is very tender with shoes on and is an active runner, marathon is upcoming, no meds for treatment   New Patient (Initial Visit)    61 y.o. female presents with the above complaint.   ROS: Denies fever chills nausea vomiting muscle aches pains calf pain back pain chest pain shortness of breath.  Past Medical History:  Diagnosis Date   H/o Lyme disease 12/04/2017   Hydrocephalus with sparing of fourth ventricle due to aqueductal stenosis (HCC) 12/04/2017   Evaluated by neurology and neurosurgeon   Vegetarian diet and gluten free 12/04/2017   Past Surgical History:  Procedure Laterality Date   CESAREAN SECTION     dermoid tumor     MICRODISCECTOMY LUMBAR     OOPHORECTOMY      Current Outpatient Medications:    FISH OIL-KRILL OIL PO, Take by mouth., Disp: , Rfl:    ibuprofen (ADVIL,MOTRIN) 200 MG tablet, Take 200 mg by mouth every 6 (six) hours as needed., Disp: , Rfl:    Multiple Vitamin (MULTIVITAMIN) tablet, Take by mouth., Disp: , Rfl:   No Known Allergies Review of Systems Objective:  There were no vitals filed for this visit.  General: Well developed, nourished, in no acute distress, alert and oriented x3   Dermatological: Skin is warm, dry and supple bilateral. Nails x 10 are well maintained; remaining integument appears unremarkable at this time. There are no open sores, no preulcerative lesions, no rash or signs of infection present.  Vascular: Dorsalis Pedis artery and Posterior Tibial artery pedal pulses are 2/4 bilateral with immedate capillary fill time. Pedal hair growth present. No varicosities and no lower extremity edema present  bilateral.   Neruologic: Grossly intact via light touch bilateral. Vibratory intact via tuning fork bilateral. Protective threshold with Semmes Wienstein monofilament intact to all pedal sites bilateral. Patellar and Achilles deep tendon reflexes 2+ bilateral. No Babinski or clonus noted bilateral.   Musculoskeletal: No gross boney pedal deformities bilateral. No pain, crepitus, or limitation noted with foot and ankle range of motion bilateral. Muscular strength 5/5 in all groups tested bilateral.  Pain and reproduction of symptoms with palpation of the fifth metatarsal head plantarly.  There is an area of fluctuance just beneath the fifth metatarsal head at the level of the neck.  This area correlates with what appears to be an old fracture of the neck of the fifth metatarsal and a small spicule that is plantarly located.  Gait: Unassisted, Nonantalgic.    Radiographs:  Radiographs taken today demonstrated general demineralization of the right foot.  She has had multiple fractures to the fifth digit of the right foot along the proximal phalanx.  The fifth metatarsal anatomic neck demonstrates a plantar spicule as if she had fractured and dorsally dislocated the head of the fifth metatarsal.  Assessment & Plan:   Assessment: I think she has a neuritis in the bursitis with a small spicule being associated with the fifth metatarsal neck plantar aspect.  Plan: Discussed etiology pathology conservative versus surgical therapies at this point we injected the area today as a trial with 10 mg Kenalog 5 mg Marcaine point  maximal tenderness.  Tolerated procedure well and I will follow-up with her to see if this worked for her in about the next 4 to 6 weeks     Ann Henderson, [place]

## 2022-08-14 ENCOUNTER — Encounter: Payer: Self-pay | Admitting: *Deleted

## 2022-08-19 ENCOUNTER — Ambulatory Visit (INDEPENDENT_AMBULATORY_CARE_PROVIDER_SITE_OTHER): Payer: BC Managed Care – PPO

## 2022-08-19 DIAGNOSIS — Z23 Encounter for immunization: Secondary | ICD-10-CM | POA: Diagnosis not present

## 2022-08-19 NOTE — Progress Notes (Signed)
Pt in office requesting 1st Shingrix vaccine and flu vaccine. Pt states no allergies or intolerances that she is aware of. Administered Shingrix in Left deltoid and Flu vaccine in Right deltoid. Pt tolerated well with no issues. Advised possible side effects of vaccines and pt verbalized understanding.

## 2022-08-20 ENCOUNTER — Ambulatory Visit: Payer: BC Managed Care – PPO

## 2022-10-14 ENCOUNTER — Other Ambulatory Visit: Payer: Self-pay | Admitting: Family Medicine

## 2022-10-14 ENCOUNTER — Ambulatory Visit
Admission: RE | Admit: 2022-10-14 | Discharge: 2022-10-14 | Disposition: A | Discharge status: Home / Self Care | Payer: BC Managed Care – PPO | Source: Ambulatory Visit | Attending: Family Medicine | Admitting: Family Medicine

## 2022-10-14 DIAGNOSIS — Z780 Asymptomatic menopausal state: Secondary | ICD-10-CM

## 2022-10-14 DIAGNOSIS — M8589 Other specified disorders of bone density and structure, multiple sites: Secondary | ICD-10-CM | POA: Diagnosis not present

## 2022-10-14 DIAGNOSIS — M8580 Other specified disorders of bone density and structure, unspecified site: Secondary | ICD-10-CM

## 2022-10-14 DIAGNOSIS — Z1231 Encounter for screening mammogram for malignant neoplasm of breast: Secondary | ICD-10-CM

## 2022-10-16 ENCOUNTER — Ambulatory Visit
Admission: RE | Admit: 2022-10-16 | Discharge: 2022-10-16 | Disposition: A | Discharge status: Home / Self Care | Payer: BC Managed Care – PPO | Source: Ambulatory Visit | Attending: Family Medicine | Admitting: Family Medicine

## 2022-10-16 DIAGNOSIS — Z1231 Encounter for screening mammogram for malignant neoplasm of breast: Secondary | ICD-10-CM

## 2022-11-04 ENCOUNTER — Ambulatory Visit (INDEPENDENT_AMBULATORY_CARE_PROVIDER_SITE_OTHER): Payer: BC Managed Care – PPO

## 2022-11-04 DIAGNOSIS — Z1389 Encounter for screening for other disorder: Secondary | ICD-10-CM | POA: Diagnosis not present

## 2022-11-04 DIAGNOSIS — Z01419 Encounter for gynecological examination (general) (routine) without abnormal findings: Secondary | ICD-10-CM | POA: Diagnosis not present

## 2022-11-04 DIAGNOSIS — Z23 Encounter for immunization: Secondary | ICD-10-CM

## 2022-11-04 DIAGNOSIS — Z1151 Encounter for screening for human papillomavirus (HPV): Secondary | ICD-10-CM | POA: Diagnosis not present

## 2022-11-04 DIAGNOSIS — Z124 Encounter for screening for malignant neoplasm of cervix: Secondary | ICD-10-CM | POA: Diagnosis not present

## 2022-11-04 NOTE — Progress Notes (Signed)
Pt in office for second Shingrix vaccine, pt advised first vaccine had flu symptoms and didn't feel well, advised at those were normal symptoms and she should expect same symptoms with this last Shingrix vaccine. Administered in left deltoid and pt tolerated well.

## 2022-11-11 DIAGNOSIS — J449 Chronic obstructive pulmonary disease, unspecified: Secondary | ICD-10-CM | POA: Diagnosis not present

## 2022-11-11 DIAGNOSIS — Z1211 Encounter for screening for malignant neoplasm of colon: Secondary | ICD-10-CM | POA: Diagnosis not present

## 2022-11-23 LAB — HM PAP SMEAR: HM Pap smear: NEGATIVE

## 2022-11-23 LAB — RESULTS CONSOLE HPV: CHL HPV: NEGATIVE

## 2023-01-20 DIAGNOSIS — D2262 Melanocytic nevi of left upper limb, including shoulder: Secondary | ICD-10-CM | POA: Diagnosis not present

## 2023-01-20 DIAGNOSIS — D225 Melanocytic nevi of trunk: Secondary | ICD-10-CM | POA: Diagnosis not present

## 2023-01-20 DIAGNOSIS — L814 Other melanin hyperpigmentation: Secondary | ICD-10-CM | POA: Diagnosis not present

## 2023-01-20 DIAGNOSIS — L821 Other seborrheic keratosis: Secondary | ICD-10-CM | POA: Diagnosis not present

## 2023-04-01 ENCOUNTER — Encounter (INDEPENDENT_AMBULATORY_CARE_PROVIDER_SITE_OTHER): Payer: Self-pay

## 2023-04-28 ENCOUNTER — Encounter: Payer: BC Managed Care – PPO | Admitting: Family Medicine

## 2023-05-01 ENCOUNTER — Encounter: Payer: Self-pay | Admitting: Family Medicine

## 2023-05-01 ENCOUNTER — Ambulatory Visit (INDEPENDENT_AMBULATORY_CARE_PROVIDER_SITE_OTHER): Payer: BC Managed Care – PPO | Admitting: Family Medicine

## 2023-05-01 VITALS — BP 108/70 | HR 57 | Temp 98.2°F | Ht 65.5 in | Wt 129.0 lb

## 2023-05-01 DIAGNOSIS — Q030 Malformations of aqueduct of Sylvius: Secondary | ICD-10-CM

## 2023-05-01 DIAGNOSIS — J449 Chronic obstructive pulmonary disease, unspecified: Secondary | ICD-10-CM | POA: Diagnosis not present

## 2023-05-01 DIAGNOSIS — Z0000 Encounter for general adult medical examination without abnormal findings: Secondary | ICD-10-CM

## 2023-05-01 DIAGNOSIS — M8580 Other specified disorders of bone density and structure, unspecified site: Secondary | ICD-10-CM

## 2023-05-01 DIAGNOSIS — K635 Polyp of colon: Secondary | ICD-10-CM | POA: Diagnosis not present

## 2023-05-01 DIAGNOSIS — Z789 Other specified health status: Secondary | ICD-10-CM

## 2023-05-01 LAB — LIPID PANEL
Cholesterol: 207 mg/dL — ABNORMAL HIGH (ref 0–200)
HDL: 60.4 mg/dL (ref >39.00)
LDL Cholesterol: 127 mg/dL — ABNORMAL HIGH (ref 0–99)
NonHDL: 146.27
Total CHOL/HDL Ratio: 3
Triglycerides: 95 mg/dL (ref 0.0–149.0)
VLDL: 19 mg/dL (ref 0.0–40.0)

## 2023-05-01 LAB — COMPREHENSIVE METABOLIC PANEL
ALT: 17 U/L (ref 0–35)
AST: 21 U/L (ref 0–37)
Albumin: 4.5 g/dL (ref 3.5–5.2)
Alkaline Phosphatase: 53 U/L (ref 39–117)
BUN: 9 mg/dL (ref 6–23)
CO2: 31 mEq/L (ref 19–32)
Calcium: 9.8 mg/dL (ref 8.4–10.5)
Chloride: 100 mEq/L (ref 96–112)
Creatinine, Ser: 0.79 mg/dL (ref 0.40–1.20)
GFR: 79.91 mL/min (ref >60.00)
Glucose, Bld: 92 mg/dL (ref 70–99)
Potassium: 4.5 mEq/L (ref 3.5–5.1)
Sodium: 137 mEq/L (ref 135–145)
Total Bilirubin: 0.5 mg/dL (ref 0.2–1.2)
Total Protein: 7.1 g/dL (ref 6.0–8.3)

## 2023-05-01 LAB — CBC WITH DIFFERENTIAL/PLATELET
Basophils Absolute: 0 10*3/uL (ref 0.0–0.1)
Basophils Relative: 0.6 % (ref 0.0–3.0)
Eosinophils Absolute: 0.1 10*3/uL (ref 0.0–0.7)
Eosinophils Relative: 2.8 % (ref 0.0–5.0)
HCT: 43.2 % (ref 36.0–46.0)
Hemoglobin: 14.2 g/dL (ref 12.0–15.0)
Lymphocytes Relative: 40.6 % (ref 12.0–46.0)
Lymphs Abs: 1.8 10*3/uL (ref 0.7–4.0)
MCHC: 32.8 g/dL (ref 30.0–36.0)
MCV: 96.1 fl (ref 78.0–100.0)
Monocytes Absolute: 0.2 10*3/uL (ref 0.1–1.0)
Monocytes Relative: 5.2 % (ref 3.0–12.0)
Neutro Abs: 2.3 10*3/uL (ref 1.4–7.7)
Neutrophils Relative %: 50.8 % (ref 43.0–77.0)
Platelets: 161 10*3/uL (ref 150.0–400.0)
RBC: 4.49 Mil/uL (ref 3.87–5.11)
RDW: 13 % (ref 11.5–15.5)
WBC: 4.5 10*3/uL (ref 4.0–10.5)

## 2023-05-01 LAB — TSH: TSH: 0.96 u[IU]/mL (ref 0.35–5.50)

## 2023-05-01 LAB — VITAMIN B12: Vitamin B-12: >1501 pg/mL — ABNORMAL HIGH (ref 211–911)

## 2023-05-01 NOTE — Progress Notes (Signed)
Subjective  Chief Complaint  Patient presents with   Annual Exam    Pt here for Annual Exam and is currently fasting     HPI: Ann Henderson is a 62 y.o. female who presents to [facility] at [place] today for a Female Wellness Visit. She also has the concerns and/or needs as listed above in the chief complaint. These will be addressed in addition to the Health Maintenance Visit.   Wellness Visit: annual visit with health maintenance review and exam without Pap  HM: Screens are current.  Has history of hyperplastic colon polyps, now due for colonoscopy surveillance.  Her GYN has made that referral.  To see Dr. Loreta Ave soon.  Female wellness is up-to-date.  Sees Dr. Senaida Ores.  Feeling well, has had some stressful family issues that she is managing well.  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  Keeps active.  Eats healthy.  No concerns.  Declines flu shots  Chronic disease f/u and/or acute problem visit: (deemed necessary to be done in addition to the wellness visit): Reviewed most recent bone density showing stable osteopenia.  Takes calcium and vitamin D Vegan diet Asymptomatic COPD by imaging study.  Remote former smoking history  Assessment  1. Annual physical exam   2. Hydrocephalus with sparing of fourth ventricle due to aqueductal stenosis (HCC)   3. Chronic obstructive pulmonary disease, unspecified COPD type (HCC)   4. Hyperplastic colonic polyp, unspecified part of colon   5. Osteopenia, unspecified location   6. Vegetarian diet and gluten free      Plan  Female Wellness Visit: Age appropriate Health Maintenance and Prevention measures were discussed with patient. Included topics are cancer screening recommendations, ways to keep healthy (see AVS) including dietary and exercise recommendations, regular eye and dental care, use of seat belts, and avoidance of moderate alcohol use and tobacco use.  For colonoscopy BMI: discussed patient's BMI and  encouraged positive lifestyle modifications to help get to or maintain a target BMI. HM needs and immunizations were addressed and ordered. See below for orders. See HM and immunization section for updates.  Declines flu shots Routine labs and screening tests ordered including cmp, cbc and lipids where appropriate. Discussed recommendations regarding Vit D and calcium supplementation (see AVS)  Chronic disease management visit and/or acute problem visit: Monitor B12 Monitor bone density every 2 years  Follow up: 12 months for complete physical Orders Placed This Encounter  Procedures   Vitamin B12   CBC with Differential/Platelet   Comprehensive metabolic panel   Lipid panel   TSH   No orders of the defined types were placed in this encounter.     Body mass index is 21.14 kg/m. Wt Readings from Last 3 Encounters:  05/01/23 129 lb (58.5 kg)  04/24/22 129 lb 6.4 oz (58.7 kg)  01/02/22 128 lb (58.1 kg)     Patient Active Problem List   Diagnosis Date Noted Date Diagnosed   COPD (chronic obstructive pulmonary disease) (HCC) 04/24/2022     Priority: High    Hyperinflated lungs on cxr 2023, former long term smoker. No sxs.     Hydrocephalus with sparing of fourth ventricle due to aqueductal stenosis [facility])      Priority: High    Evaluated by neurology and neurosurgeon    Osteopenia 04/24/2022     Priority: Medium     dexa 2018 lowest T=-1.8, [facility] center Dexa 10/2022: The BMD measured at Femur Neck Left is 0.778 g/cm2 with a  T-score of -1.9. , stable osteopenia; recheck 2 years.     Hyperplastic colon polyp 12/04/2017     Priority: Medium     10 year recall (Dr. Ewing Schlein)    DJD (degenerative joint disease) of cervical spine 12/04/2017     Priority: Medium    Umbilical hernia 12/04/2017     Priority: Medium    H/o Lyme disease 12/04/2017     Priority: Medium    Atypical chest pain 2022 01/02/2022     Priority: Low    Nl EKG. Normal ETT    Seasonal allergic rhinitis  12/04/2017     Priority: Low   Vegetarian diet and gluten free 12/04/2017     Priority: Low   Health Maintenance  Topic Date Due   Colonoscopy  07/14/2023   COVID-19 Vaccine (3 - 2023-24 season) 05/17/2023 (Originally 05/02/2022)   MAMMOGRAM  10/17/2023   DEXA SCAN  10/14/2024   PAP SMEAR-Modifier  09/25/2026   DTaP/Tdap/Td (4 - Td or Tdap) 03/06/2031   Hepatitis C Screening  Completed   HIV Screening  Completed   Zoster Vaccines- Shingrix  Completed   HPV VACCINES  Aged Out   INFLUENZA VACCINE  Discontinued   Immunization History  Administered Date(s) Administered   Influenza,inj,Quad PF,6+ Mos 08/19/2022   PFIZER(Purple Top)SARS-COV-2 Vaccination 10/14/2019, 11/08/2019   Td 09/02/1999, 12/26/2009   Tdap 03/05/2021   Zoster Recombinant(Shingrix) 08/19/2022, 11/04/2022   We updated and reviewed the patient's past history in detail and it is documented below. Allergies: Patient has No Known Allergies. Past Medical History Patient  has a past medical history of H/o Lyme disease (12/04/2017), Hydrocephalus with sparing of fourth ventricle due to aqueductal stenosis (HCC) (12/04/2017), and Vegetarian diet and gluten free (12/04/2017). Past Surgical History Patient  has a past surgical history that includes Microdiscectomy lumbar; dermoid tumor; Oophorectomy; and Cesarean section. Family History: Patient family history includes Early death in her mother; Healthy in her daughter, daughter, father, and son; Heart disease in her maternal grandfather, paternal grandfather, and paternal grandmother; Lung cancer in her mother; Mental illness in her sister; Pulmonary embolism in her mother. Social History:  Patient  reports that she quit smoking about 33 years ago. Her smoking use included cigarettes. She has never used smokeless tobacco. She reports current alcohol use. She reports that she does not use drugs.  Review of Systems: Constitutional: negative for fever or malaise Ophthalmic:  negative for photophobia, double vision or loss of vision Cardiovascular: negative for chest pain, dyspnea on exertion, or new LE swelling Respiratory: negative for SOB or persistent cough Gastrointestinal: negative for abdominal pain, change in bowel habits or melena Genitourinary: negative for dysuria or gross hematuria, no abnormal uterine bleeding or disharge Musculoskeletal: negative for new gait disturbance or muscular weakness Integumentary: negative for new or persistent rashes, no breast lumps Neurological: negative for TIA or stroke symptoms Psychiatric: negative for SI or delusions Allergic/Immunologic: negative for hives  Patient Care Team    Relationship Specialty Notifications Start End  Willow Ora, MD PCP - General Family Medicine  12/04/17   Huel Cote, MD Consulting Physician Obstetrics and Gynecology  12/04/17   Vida Rigger, MD Consulting Physician Gastroenterology  12/04/17   Donzetta Starch, MD Consulting Physician Dermatology  03/29/19     Objective  Vitals: BP 108/70   Pulse (!) 57   Temp 98.2 F (36.8 C)   Ht 5' 5.5" (1.664 m)   Wt 129 lb (58.5 kg)   LMP 04/04/2011   SpO2  96%   BMI 21.14 kg/m  General:  Well developed, well nourished, no acute distress  Psych:  Alert and orientedx3,normal mood and affect HEENT:  Normocephalic, atraumatic, non-icteric sclera,  supple neck without adenopathy, mass or thyromegaly Cardiovascular:  Normal S1, S2, RRR without gallop, rub or murmur Respiratory:  Good breath sounds bilaterally, CTAB with normal respiratory effort Gastrointestinal: normal bowel sounds, soft, non-tender, no noted masses. No HSM MSK: extremities without edema, joints without erythema or swelling Neurologic:    Mental status is normal.  Gross motor and sensory exams are normal.  No tremor  Commons side effects, risks, benefits, and alternatives for medications and treatment plan prescribed today were discussed, and the patient expressed  understanding of the given instructions. Patient is instructed to call or message via MyChart if he/she has any questions or concerns regarding our treatment plan. No barriers to understanding were identified. We discussed Red Flag symptoms and signs in detail. Patient expressed understanding regarding what to do in case of urgent or emergency type symptoms.  Medication list was reconciled, printed and provided to the patient in AVS. Patient instructions and summary information was reviewed with the patient as documented in the AVS. This note was prepared with assistance of Dragon voice recognition software. Occasional wrong-word or sound-a-like substitutions may have occurred due to the inherent limitations of voice recognition software

## 2023-05-01 NOTE — Patient Instructions (Signed)
Please return in 12 months for your annual complete physical; please come fasting.   I will release your lab results to you on your MyChart account with further instructions. You may see the results before I do, but when I review them I will send you a message with my report or have my assistant call you if things need to be discussed. Please reply to my message with any questions. Thank you!   If you have any questions or concerns, please don't hesitate to send me a message via MyChart or call the office at [phone]. Thank you for visiting with us today! It's our pleasure caring for you.  

## 2023-05-06 NOTE — Progress Notes (Signed)
Labs reviewed. Can back down on b12 supplements.  The 10-year ASCVD risk score (Arnett DK, et al., 2019) is: 2.8%   Values used to calculate the score:     Age: 62 years     Sex: Female     Is Non-Hispanic African American: No     Diabetic: No     Tobacco smoker: No     Systolic Blood Pressure: 108 mmHg     Is BP treated: No     HDL Cholesterol: 60.4 mg/dL     Total Cholesterol: 207 mg/dL

## 2023-10-16 DIAGNOSIS — Z1211 Encounter for screening for malignant neoplasm of colon: Secondary | ICD-10-CM | POA: Diagnosis not present

## 2023-10-16 LAB — HM COLONOSCOPY

## 2023-11-19 ENCOUNTER — Other Ambulatory Visit: Payer: Self-pay | Admitting: Family Medicine

## 2023-11-19 DIAGNOSIS — Z1231 Encounter for screening mammogram for malignant neoplasm of breast: Secondary | ICD-10-CM

## 2023-11-27 ENCOUNTER — Ambulatory Visit

## 2023-12-04 ENCOUNTER — Ambulatory Visit
Admission: RE | Admit: 2023-12-04 | Discharge: 2023-12-04 | Disposition: A | Discharge status: Home / Self Care | Source: Ambulatory Visit | Attending: Family Medicine | Admitting: Family Medicine

## 2023-12-04 DIAGNOSIS — Z1231 Encounter for screening mammogram for malignant neoplasm of breast: Secondary | ICD-10-CM | POA: Diagnosis not present

## 2024-01-04 DIAGNOSIS — Z01419 Encounter for gynecological examination (general) (routine) without abnormal findings: Secondary | ICD-10-CM | POA: Diagnosis not present

## 2024-02-29 DIAGNOSIS — L821 Other seborrheic keratosis: Secondary | ICD-10-CM | POA: Diagnosis not present

## 2024-02-29 DIAGNOSIS — D692 Other nonthrombocytopenic purpura: Secondary | ICD-10-CM | POA: Diagnosis not present

## 2024-02-29 DIAGNOSIS — D225 Melanocytic nevi of trunk: Secondary | ICD-10-CM | POA: Diagnosis not present

## 2024-02-29 DIAGNOSIS — L814 Other melanin hyperpigmentation: Secondary | ICD-10-CM | POA: Diagnosis not present

## 2024-05-06 ENCOUNTER — Ambulatory Visit (INDEPENDENT_AMBULATORY_CARE_PROVIDER_SITE_OTHER): Admitting: Family Medicine

## 2024-05-06 ENCOUNTER — Encounter: Payer: Self-pay | Admitting: Family Medicine

## 2024-05-06 VITALS — BP 135/81 | HR 58 | Temp 97.7°F | Ht 65.5 in

## 2024-05-06 DIAGNOSIS — Z789 Other specified health status: Secondary | ICD-10-CM | POA: Diagnosis not present

## 2024-05-06 DIAGNOSIS — M8580 Other specified disorders of bone density and structure, unspecified site: Secondary | ICD-10-CM

## 2024-05-06 DIAGNOSIS — Q030 Malformations of aqueduct of Sylvius: Secondary | ICD-10-CM

## 2024-05-06 DIAGNOSIS — J449 Chronic obstructive pulmonary disease, unspecified: Secondary | ICD-10-CM

## 2024-05-06 DIAGNOSIS — Z0001 Encounter for general adult medical examination with abnormal findings: Secondary | ICD-10-CM

## 2024-05-06 LAB — COMPREHENSIVE METABOLIC PANEL WITH GFR
ALT: 19 U/L (ref 0–35)
AST: 20 U/L (ref 0–37)
Albumin: 4.4 g/dL (ref 3.5–5.2)
Alkaline Phosphatase: 59 U/L (ref 39–117)
BUN: 10 mg/dL (ref 6–23)
CO2: 29 meq/L (ref 19–32)
Calcium: 9.1 mg/dL (ref 8.4–10.5)
Chloride: 102 meq/L (ref 96–112)
Creatinine, Ser: 0.76 mg/dL (ref 0.40–1.20)
GFR: 83.12 mL/min (ref >60.00)
Glucose, Bld: 96 mg/dL (ref 70–99)
Potassium: 4.2 meq/L (ref 3.5–5.1)
Sodium: 140 meq/L (ref 135–145)
Total Bilirubin: 0.8 mg/dL (ref 0.2–1.2)
Total Protein: 6.7 g/dL (ref 6.0–8.3)

## 2024-05-06 LAB — LIPID PANEL
Cholesterol: 188 mg/dL (ref 0–200)
HDL: 64.8 mg/dL (ref >39.00)
LDL Cholesterol: 107 mg/dL — ABNORMAL HIGH (ref 0–99)
NonHDL: 123.49
Total CHOL/HDL Ratio: 3
Triglycerides: 83 mg/dL (ref 0.0–149.0)
VLDL: 16.6 mg/dL (ref 0.0–40.0)

## 2024-05-06 LAB — VITAMIN B12: Vitamin B-12: 1124 pg/mL — ABNORMAL HIGH (ref 211–911)

## 2024-05-06 LAB — VITAMIN D 25 HYDROXY (VIT D DEFICIENCY, FRACTURES): VITD: 45.33 ng/mL (ref 30.00–100.00)

## 2024-05-06 LAB — TSH: TSH: 0.75 u[IU]/mL (ref 0.35–5.50)

## 2024-05-06 NOTE — Addendum Note (Signed)
 Addended by: NYLE MACINTOSH A on: 05/06/2024 03:35 PM   Modules accepted: Orders

## 2024-05-06 NOTE — Progress Notes (Signed)
 Subjective  Chief Complaint  Patient presents with   Annual Exam    Pt here for Annual Exam and is currently fasting. Records or pap has been requested   osteopenia    HPI: Ann Henderson is a 63 y.o. female who presents to [facility] at [place] today for a Female Wellness Visit. She also has the concerns and/or needs as listed above in the chief complaint. These will be addressed in addition to the Health Maintenance Visit.   Wellness Visit: annual visit with health maintenance review and exam  HM: Sees GYN.  I reviewed recent GYN wellness visit.  Pap smear current last done 2024.  Normal with negative high-risk HPV.  Mammogram current and normal.  Colorectal cancer screening up-to-date.  Exercises regularly, runs 3-5 miles per day.  Healthy diet.  Vegetarian.  Working a lot right now so perhaps overworked but otherwise well.  Sleeps well. Chronic disease f/u and/or acute problem visit: (deemed necessary to be done in addition to the wellness visit): New concern, will often smell of ammonia for unclear reasons.  No other symptoms. Intermittent lightheadedness and vertigo when bending forward.  She hydrates well.  Uses electrolytes, eats well.  She does have a high caffeine intake.  No palpitations, sweats, jitters, anxiety, chest pain or edema. Reviewed most recent bone density with osteopenia. COPD by x-ray, no symptoms.  Assessment  1. Encounter for well adult exam with abnormal findings   2. Hydrocephalus with sparing of fourth ventricle due to aqueductal stenosis (HCC)   3. Chronic obstructive pulmonary disease, unspecified COPD type (HCC)   4. Osteopenia, unspecified location   5. Vegetarian diet and gluten free      Plan  Female Wellness Visit: Age appropriate Health Maintenance and Prevention measures were discussed with patient. Included topics are cancer screening recommendations, ways to keep healthy (see AVS) including dietary and exercise recommendations,  regular eye and dental care, use of seat belts, and avoidance of moderate alcohol use and tobacco use.  Screens are current BMI: discussed patient's BMI and encouraged positive lifestyle modifications to help get to or maintain a target BMI. HM needs and immunizations were addressed and ordered. See below for orders. See HM and immunization section for updates.  She will return for flu shot and Prevnar.  Defers today Routine labs and screening tests ordered including cmp, cbc and lipids where appropriate. Discussed recommendations regarding Vit D and calcium supplementation (see AVS)  Chronic disease management visit and/or acute problem visit: Will check lab work.  Unclear reasons more symptoms of smelling ammonia and intermittent lightheadedness.  She does have history of low blood pressure typically.  No syncope.  Will monitor  Follow up: 1 year for complete physical Orders Placed This Encounter  Procedures   HM PAP SMEAR   TSH   VITAMIN D  25 Hydroxy (Vit-D Deficiency, Fractures)   CBC with Differential/Platelet   Comprehensive metabolic panel with GFR   Lipid panel   Vitamin B12   No orders of the defined types were placed in this encounter.     Body mass index is 21.14 kg/m. Wt Readings from Last 3 Encounters:  05/01/23 129 lb (58.5 kg)  04/24/22 129 lb 6.4 oz (58.7 kg)  01/02/22 128 lb (58.1 kg)     Patient Active Problem List   Diagnosis Date Noted   COPD (chronic obstructive pulmonary disease) (HCC) 04/24/2022    Priority: High    Hyperinflated lungs on cxr 2023, former long term smoker.  No sxs.     Hydrocephalus with sparing of fourth ventricle due to aqueductal stenosis [facility])     Priority: High    Evaluated by neurology and neurosurgeon    Osteopenia 04/24/2022    Priority: Medium     dexa 2018 lowest T=-1.8, breast center Dexa 10/2022: The BMD measured at Femur Neck Left is 0.778 g/cm2 with a T-score of -1.9. , stable osteopenia; recheck 2 years.      Hyperplastic colon polyp 12/04/2017    Priority: Medium     10 year recall (Dr. Rosalie)    DJD (degenerative joint disease) of cervical spine 12/04/2017    Priority: Medium    Umbilical hernia 12/04/2017    Priority: Medium    H/o Lyme disease 12/04/2017    Priority: Medium    Atypical chest pain 2022 01/02/2022    Priority: Low    Nl EKG. Normal ETT    Seasonal allergic rhinitis 12/04/2017    Priority: Low   Vegetarian diet and gluten free 12/04/2017    Priority: Low   Health Maintenance  Topic Date Due   Pneumococcal Vaccine: 50+ Years (1 of 2 - PCV) Never done   COVID-19 Vaccine (3 - 2025-26 season) 05/22/2024 (Originally 05/02/2024)   DEXA SCAN  10/14/2024   MAMMOGRAM  12/03/2024   Cervical Cancer Screening (HPV/Pap Cotest)  11/23/2027   DTaP/Tdap/Td (4 - Td or Tdap) 03/06/2031   Colonoscopy  10/15/2033   Hepatitis C Screening  Completed   HIV Screening  Completed   Zoster Vaccines- Shingrix   Completed   Hepatitis B Vaccines 19-59 Average Risk  Aged Out   HPV VACCINES  Aged Out   Meningococcal B Vaccine  Aged Out   Influenza Vaccine  Discontinued   Immunization History  Administered Date(s) Administered   Influenza,inj,Quad PF,6+ Mos 08/19/2022   PFIZER(Purple Top)SARS-COV-2 Vaccination 10/14/2019, 11/08/2019   Td 09/02/1999, 12/26/2009   Tdap 03/05/2021   Zoster Recombinant(Shingrix ) 08/19/2022, 11/04/2022   We updated and reviewed the patient's past history in detail and it is documented below. Allergies: Patient has no known allergies. Past Medical History Patient  has a past medical history of H/o Lyme disease (12/04/2017), Hydrocephalus with sparing of fourth ventricle due to aqueductal stenosis (HCC) (12/04/2017), and Vegetarian diet and gluten free (12/04/2017). Past Surgical History Patient  has a past surgical history that includes Microdiscectomy lumbar; dermoid tumor; Oophorectomy; and Cesarean section. Family History: Patient family history includes Early  death in her mother; Healthy in her daughter, daughter, father, and son; Heart disease in her maternal grandfather, paternal grandfather, and paternal grandmother; Lung cancer in her mother; Mental illness in her sister; Pulmonary embolism in her mother. Social History:  Patient  reports that she quit smoking about 34 years ago. Her smoking use included cigarettes. She has never used smokeless tobacco. She reports current alcohol use. She reports that she does not use drugs.  Review of Systems: Constitutional: negative for fever or malaise Ophthalmic: negative for photophobia, double vision or loss of vision Cardiovascular: negative for chest pain, dyspnea on exertion, or new LE swelling Respiratory: negative for SOB or persistent cough Gastrointestinal: negative for abdominal pain, change in bowel habits or melena Genitourinary: negative for dysuria or gross hematuria, no abnormal uterine bleeding or disharge Musculoskeletal: negative for new gait disturbance or muscular weakness Integumentary: negative for new or persistent rashes, no breast lumps Neurological: negative for TIA or stroke symptoms Psychiatric: negative for SI or delusions Allergic/Immunologic: negative for hives  Patient Care Team  Relationship Specialty Notifications Start End  Jodie Lavern CROME, MD PCP - General Family Medicine  12/04/17   Estelle Service, MD Consulting Physician Obstetrics and Gynecology  12/04/17   Rosalie Kitchens, MD Consulting Physician Gastroenterology  12/04/17   Joshua Blamer, MD Consulting Physician Dermatology  03/29/19     Objective  Vitals: BP 135/81   Pulse (!) 58   Temp 97.7 F (36.5 C)   Ht 5' 5.5 (1.664 m)   LMP 04/04/2011   SpO2 100%   BMI 21.14 kg/m  General:  Well developed, well nourished, no acute distress  Psych:  Alert and orientedx3,normal mood and affect HEENT:  Normocephalic, atraumatic, non-icteric sclera,  supple neck without adenopathy, mass or thyromegaly Cardiovascular:   Normal S1, S2, RRR without gallop, rub or murmur Respiratory:  Good breath sounds bilaterally, CTAB with normal respiratory effort Gastrointestinal: normal bowel sounds, soft, non-tender, no noted masses. No HSM MSK: extremities without edema, joints without erythema or swelling Neurologic:    Mental status is normal.  Gross motor and sensory exams are normal.  No tremor  Commons side effects, risks, benefits, and alternatives for medications and treatment plan prescribed today were discussed, and the patient expressed understanding of the given instructions. Patient is instructed to call or message via MyChart if he/she has any questions or concerns regarding our treatment plan. No barriers to understanding were identified. We discussed Red Flag symptoms and signs in detail. Patient expressed understanding regarding what to do in case of urgent or emergency type symptoms.  Medication list was reconciled, printed and provided to the patient in AVS. Patient instructions and summary information was reviewed with the patient as documented in the AVS. This note was prepared with assistance of Dragon voice recognition software. Occasional wrong-word or sound-a-like substitutions may have occurred due to the inherent limitations of voice recognition software

## 2024-05-11 ENCOUNTER — Ambulatory Visit: Payer: Self-pay | Admitting: Family

## 2024-06-24 ENCOUNTER — Other Ambulatory Visit (INDEPENDENT_AMBULATORY_CARE_PROVIDER_SITE_OTHER)

## 2024-06-24 DIAGNOSIS — Z0001 Encounter for general adult medical examination with abnormal findings: Secondary | ICD-10-CM | POA: Diagnosis not present

## 2024-06-24 LAB — CBC WITH DIFFERENTIAL/PLATELET
Basophils Absolute: 0 K/uL (ref 0.0–0.1)
Basophils Relative: 0.7 % (ref 0.0–3.0)
Eosinophils Absolute: 0.1 K/uL (ref 0.0–0.7)
Eosinophils Relative: 2 % (ref 0.0–5.0)
HCT: 43.9 % (ref 36.0–46.0)
Hemoglobin: 14.6 g/dL (ref 12.0–15.0)
Lymphocytes Relative: 34.6 % (ref 12.0–46.0)
Lymphs Abs: 1.2 K/uL (ref 0.7–4.0)
MCHC: 33.3 g/dL (ref 30.0–36.0)
MCV: 94.8 fl (ref 78.0–100.0)
Monocytes Absolute: 0.2 K/uL (ref 0.1–1.0)
Monocytes Relative: 6.2 % (ref 3.0–12.0)
Neutro Abs: 1.9 K/uL (ref 1.4–7.7)
Neutrophils Relative %: 56.5 % (ref 43.0–77.0)
Platelets: 129 K/uL — ABNORMAL LOW (ref 150.0–400.0)
RBC: 4.63 Mil/uL (ref 3.87–5.11)
RDW: 13 % (ref 11.5–15.5)
WBC: 3.4 K/uL — ABNORMAL LOW (ref 4.0–10.5)

## 2024-07-05 NOTE — Progress Notes (Signed)
 See mychart note

## 2025-05-12 ENCOUNTER — Encounter: Admitting: Family Medicine
